# Patient Record
Sex: Male | Born: 1965
Health system: Southern US, Community
[De-identification: ages and names within clinical notes are randomized; demographics above are authoritative.]

## PROBLEM LIST (undated history)

## (undated) DIAGNOSIS — F1011 Alcohol abuse, in remission: Secondary | ICD-10-CM

## (undated) DIAGNOSIS — I10 Essential (primary) hypertension: Secondary | ICD-10-CM

## (undated) DIAGNOSIS — E785 Hyperlipidemia, unspecified: Secondary | ICD-10-CM

## (undated) DIAGNOSIS — I509 Heart failure, unspecified: Secondary | ICD-10-CM

## (undated) HISTORY — DX: Hyperlipidemia, unspecified: E78.5

## (undated) HISTORY — DX: Alcohol abuse, in remission: F10.11

## (undated) HISTORY — DX: Heart failure, unspecified: I50.9

## (undated) HISTORY — DX: Essential (primary) hypertension: I10

---

## 2000-12-29 ENCOUNTER — Emergency Department (HOSPITAL_COMMUNITY): Admission: EM | Admit: 2000-12-29 | Discharge: 2000-12-29 | Payer: Self-pay | Admitting: Emergency Medicine

## 2001-08-27 ENCOUNTER — Emergency Department (HOSPITAL_COMMUNITY): Admission: EM | Admit: 2001-08-27 | Discharge: 2001-08-27 | Payer: Self-pay | Admitting: Emergency Medicine

## 2003-11-29 ENCOUNTER — Emergency Department (HOSPITAL_COMMUNITY): Admission: EM | Admit: 2003-11-29 | Discharge: 2003-11-29 | Payer: Self-pay | Admitting: Emergency Medicine

## 2005-03-22 ENCOUNTER — Inpatient Hospital Stay (HOSPITAL_COMMUNITY): Admission: AD | Admit: 2005-03-22 | Discharge: 2005-03-26 | Payer: Self-pay | Admitting: Cardiology

## 2005-03-23 ENCOUNTER — Encounter: Payer: Self-pay | Admitting: Cardiovascular Disease

## 2005-03-24 ENCOUNTER — Ambulatory Visit: Payer: Self-pay | Admitting: Cardiology

## 2005-04-10 ENCOUNTER — Ambulatory Visit: Payer: Self-pay | Admitting: Internal Medicine

## 2005-04-24 ENCOUNTER — Ambulatory Visit: Payer: Self-pay | Admitting: Internal Medicine

## 2005-04-24 ENCOUNTER — Ambulatory Visit (HOSPITAL_COMMUNITY): Admission: RE | Admit: 2005-04-24 | Discharge: 2005-04-24 | Payer: Self-pay | Admitting: Internal Medicine

## 2005-05-01 ENCOUNTER — Ambulatory Visit: Payer: Self-pay | Admitting: Internal Medicine

## 2005-05-08 ENCOUNTER — Ambulatory Visit: Payer: Self-pay

## 2005-06-12 ENCOUNTER — Ambulatory Visit: Payer: Self-pay | Admitting: Internal Medicine

## 2005-07-03 ENCOUNTER — Ambulatory Visit: Payer: Self-pay | Admitting: Internal Medicine

## 2005-08-21 ENCOUNTER — Ambulatory Visit: Payer: Self-pay | Admitting: Internal Medicine

## 2005-10-09 ENCOUNTER — Ambulatory Visit: Payer: Self-pay | Admitting: Cardiology

## 2005-11-20 ENCOUNTER — Ambulatory Visit: Payer: Self-pay | Admitting: Cardiology

## 2006-01-22 ENCOUNTER — Ambulatory Visit: Payer: Self-pay | Admitting: Internal Medicine

## 2006-02-04 ENCOUNTER — Emergency Department (HOSPITAL_COMMUNITY): Admission: EM | Admit: 2006-02-04 | Discharge: 2006-02-04 | Payer: Self-pay | Admitting: Emergency Medicine

## 2006-02-08 ENCOUNTER — Ambulatory Visit: Payer: Self-pay | Admitting: Cardiology

## 2006-05-10 ENCOUNTER — Ambulatory Visit: Payer: Self-pay | Admitting: Cardiology

## 2006-05-23 ENCOUNTER — Encounter: Payer: Self-pay | Admitting: Cardiology

## 2006-05-23 ENCOUNTER — Ambulatory Visit: Payer: Self-pay | Admitting: Internal Medicine

## 2006-05-23 ENCOUNTER — Ambulatory Visit: Payer: Self-pay

## 2006-06-10 ENCOUNTER — Ambulatory Visit: Payer: Self-pay | Admitting: Cardiology

## 2006-07-12 ENCOUNTER — Ambulatory Visit: Payer: Self-pay

## 2006-08-19 ENCOUNTER — Ambulatory Visit: Payer: Self-pay | Admitting: Internal Medicine

## 2006-08-27 ENCOUNTER — Ambulatory Visit: Payer: Self-pay

## 2006-10-14 ENCOUNTER — Ambulatory Visit: Payer: Self-pay | Admitting: Internal Medicine

## 2007-01-21 ENCOUNTER — Ambulatory Visit: Payer: Self-pay | Admitting: Internal Medicine

## 2007-01-21 LAB — CONVERTED CEMR LAB
Calcium: 10.1 mg/dL (ref 8.4–10.5)
Chloride: 102 meq/L (ref 96–112)
Creatinine, Ser: 1.3 mg/dL (ref 0.4–1.5)
GFR calc non Af Amer: 65 mL/min
Glucose, Bld: 103 mg/dL — ABNORMAL HIGH (ref 70–99)
Potassium: 3.9 meq/L (ref 3.5–5.1)
Pro B Natriuretic peptide (BNP): 8 pg/mL (ref 0.0–100.0)
Sodium: 139 meq/L (ref 135–145)

## 2007-02-10 ENCOUNTER — Ambulatory Visit: Payer: Self-pay

## 2007-02-10 ENCOUNTER — Encounter: Payer: Self-pay | Admitting: Internal Medicine

## 2007-03-18 ENCOUNTER — Ambulatory Visit: Payer: Self-pay | Admitting: Cardiology

## 2007-06-17 ENCOUNTER — Ambulatory Visit: Payer: Self-pay | Admitting: Cardiology

## 2007-09-29 ENCOUNTER — Ambulatory Visit: Payer: Self-pay | Admitting: Cardiology

## 2007-11-25 ENCOUNTER — Ambulatory Visit: Payer: Self-pay | Admitting: Cardiology

## 2007-12-23 ENCOUNTER — Ambulatory Visit: Payer: Self-pay | Admitting: Cardiology

## 2007-12-23 LAB — CONVERTED CEMR LAB
AST: 33 units/L (ref 0–37)
Albumin: 4.1 g/dL (ref 3.5–5.2)
Alkaline Phosphatase: 53 units/L (ref 39–117)
CO2: 30 meq/L (ref 19–32)
Cholesterol: 200 mg/dL (ref 0–200)
GFR calc Af Amer: 66 mL/min
Glucose, Bld: 103 mg/dL — ABNORMAL HIGH (ref 70–99)
LDL Cholesterol: 130 mg/dL — ABNORMAL HIGH (ref 0–99)
Pro B Natriuretic peptide (BNP): 8 pg/mL (ref 0.0–100.0)
Sodium: 138 meq/L (ref 135–145)

## 2008-04-13 ENCOUNTER — Ambulatory Visit: Payer: Self-pay | Admitting: Cardiology

## 2008-04-13 LAB — CONVERTED CEMR LAB
CO2: 28 meq/L (ref 19–32)
Creatinine, Ser: 1.5 mg/dL (ref 0.4–1.5)
GFR calc non Af Amer: 55 mL/min
Glucose, Bld: 100 mg/dL — ABNORMAL HIGH (ref 70–99)
Potassium: 4.1 meq/L (ref 3.5–5.1)
Pro B Natriuretic peptide (BNP): 11 pg/mL (ref 0.0–100.0)

## 2009-01-05 DIAGNOSIS — I509 Heart failure, unspecified: Secondary | ICD-10-CM | POA: Insufficient documentation

## 2009-01-05 DIAGNOSIS — E785 Hyperlipidemia, unspecified: Secondary | ICD-10-CM

## 2009-01-05 DIAGNOSIS — I10 Essential (primary) hypertension: Secondary | ICD-10-CM | POA: Insufficient documentation

## 2009-01-06 ENCOUNTER — Ambulatory Visit: Payer: Self-pay | Admitting: Cardiology

## 2009-01-06 LAB — CONVERTED CEMR LAB
Creatinine, Ser: 1.4 mg/dL (ref 0.4–1.5)
Glucose, Bld: 125 mg/dL — ABNORMAL HIGH (ref 70–99)
Potassium: 4.3 meq/L (ref 3.5–5.1)
Pro B Natriuretic peptide (BNP): 16 pg/mL (ref 0.0–100.0)

## 2009-08-01 ENCOUNTER — Encounter (INDEPENDENT_AMBULATORY_CARE_PROVIDER_SITE_OTHER): Payer: Self-pay | Admitting: *Deleted

## 2009-08-30 ENCOUNTER — Ambulatory Visit: Payer: Self-pay | Admitting: Internal Medicine

## 2009-09-30 LAB — CONVERTED CEMR LAB
ALT: 35 units/L (ref 0–53)
AST: 43 units/L — ABNORMAL HIGH (ref 0–37)
Alkaline Phosphatase: 61 units/L (ref 39–117)
BUN: 22 mg/dL (ref 6–23)
CO2: 28 meq/L (ref 19–32)
Chloride: 103 meq/L (ref 96–112)
Cholesterol: 254 mg/dL — ABNORMAL HIGH (ref 0–200)
GFR calc non Af Amer: 84.59 mL/min (ref >60)
HDL: 47.2 mg/dL (ref >39.00)
Potassium: 4 meq/L (ref 3.5–5.1)
Total Bilirubin: 0.6 mg/dL (ref 0.3–1.2)
Total CHOL/HDL Ratio: 5
Total Protein: 7.5 g/dL (ref 6.0–8.3)
Triglycerides: 187 mg/dL — ABNORMAL HIGH (ref 0.0–149.0)
VLDL: 37.4 mg/dL (ref 0.0–40.0)

## 2009-10-04 ENCOUNTER — Encounter: Payer: Self-pay | Admitting: Cardiology

## 2010-02-14 ENCOUNTER — Ambulatory Visit: Payer: Self-pay | Admitting: Cardiology

## 2010-02-14 DIAGNOSIS — I426 Alcoholic cardiomyopathy: Secondary | ICD-10-CM

## 2010-10-08 LAB — CONVERTED CEMR LAB
CO2: 30 meq/L (ref 19–32)
Chloride: 103 meq/L (ref 96–112)
Creatinine, Ser: 1.2 mg/dL (ref 0.4–1.5)

## 2010-10-10 NOTE — Assessment & Plan Note (Signed)
Summary: 6 MONTH ROV/SL  Medications Added MULTIVITAMINS   TABS (MULTIPLE VITAMIN) 2 by mouth daily RED YEAST RICE EXTRACT  POWD (RED YEAST RICE EXTRACT) 1 by mouth daily      Allergies Added: NKDA   Visit Type:  Follow-up Primary Provider:  no  CC:  Cardiomyopathy.  History of Present Illness: The patient presents for followup of a dilated cardiomyopathy. He previously had an ejection fraction of about 20% felt to be related to EtOH or hypertension. His followup echo in 2008 demonstrated his EF to be 60%. He feels much better than he did in the past. He is breathing normally. He exercises routinely. He watches his salt and fluid. He drinks alcohol only on the weekends. He takes all of his medications as written below. He denies any shortness of breath, PND or orthopnea.  He denies any palpitations, presyncope or syncope.  Current Medications (verified): 1)  Bisoprolol-Hydrochlorothiazide 10-6.25 Mg Tabs (Bisoprolol-Hydrochlorothiazide) .... One By Mouth Dialy 2)  Lisinopril 20 Mg Tabs (Lisinopril) .... One By Mouth Two Times A Day 3)  Klor-Con M20 20 Meq Cr-Tabs (Potassium Chloride Crys Cr) .... As Needed 4)  Furosemide 20 Mg Tabs (Furosemide) .... As Needed 5)  Fish Oil 300 Mg Caps (Omega-3 Fatty Acids) .... Two Tabs Once Daily 6)  Aspirin 81 Mg  Tabs (Aspirin) .Marland Kitchen.. 1 By Mouth Daily 7)  Multivitamins   Tabs (Multiple Vitamin) .... 2 By Mouth Daily 8)  Red Yeast Rice Extract  Powd (Red Yeast Rice Extract) .Marland Kitchen.. 1 By Mouth Daily  Allergies (verified): No Known Drug Allergies  Past History:  Past Medical History:  1. Congestive heart failure secondary to nonischemic cardiomyopathy,       EF normal by echocardiogram (2008)  2. History of EtOH abuse.   3. Hypertension   4. Dyslipidemia.      Review of Systems       As stated in the HPI and negative for all other systems.   Vital Signs:  Patient profile:   45 year old male Height:      67 inches Weight:      240  pounds BMI:     37.73 Pulse rate:   67 / minute Resp:     16 per minute BP sitting:   110 / 78  (right arm)  Vitals Entered By: Marrion Coy, CNA (February 14, 2010 4:00 PM)  Physical Exam  General:  Well developed, well nourished, in no acute distress. Head:  normocephalic and atraumatic Neck:  Neck supple, no JVD. No masses, thyromegaly or abnormal cervical nodes. Chest Wall:  no deformities or breast masses noted Lungs:  Clear bilaterally to auscultation and percussion. Heart:  Non-displaced PMI, chest non-tender; regular rate and rhythm, S1, S2 without murmurs, rubs or gallops. Carotid upstroke normal, no bruit. Normal abdominal aortic size, no bruits. Femorals normal pulses, no bruits. Pedals normal pulses. No edema, no varicosities. Abdomen:  Bowel sounds positive; abdomen soft and non-tender without masses, organomegaly, or hernias noted. No hepatosplenomegaly. Msk:  Back normal, normal gait. Muscle strength and tone normal. Extremities:  No clubbing or cyanosis. Neurologic:  Alert and oriented x 3. Skin:  Intact without lesions or rashes. Cervical Nodes:  no significant adenopathy Inguinal Nodes:  no significant adenopathy Psych:  Normal affect.   EKG  Procedure date:  02/14/2010  Findings:      Sinus rhythm, rate 67, axis within normal limits, intervals within normal limits, no acute ST-T wave changes.  Impression & Recommendations:  Problem # 1:  CHF (ICD-428.0) The patient is doing well symptomatically and I would not suspect that his ejection fraction is lower. Therefore, no further cardiovascular testing is suggested. Orders: EKG w/ Interpretation (93000) TLB-BMP (Basic Metabolic Panel-BMET) (80048-METABOL)  Problem # 2:  HYPERTENSION (ICD-401.9) His  blood pressure is well controlled and she will continue on the meds as listed.  Orders: EKG w/ Interpretation (93000) TLB-BMP (Basic Metabolic Panel-BMET) (80048-METABOL)  Problem # 3:  ALCOHOLIC CARDIOMYOPATHY  (ICD-425.5) I have suggested complete abstinence.  Patient Instructions: 1)  Your physician recommends that you schedule a follow-up appointment in: 1 year with Dr Antoine Poche 2)  Your physician recommends that you have lab work today 3)  Your physician recommends that you continue on your current medications as directed. Please refer to the Current Medication list given to you today. 4)  You have been diagnosed with Congestive Heart Failure or CHF.  CHF is a condition in which a problem with the structure or function of the heart impairs its ability to supply sufficient blood flow to meet the body's needs.  For further information please visit www.cardiosmart.org for detailed information on CHF. 5)  Your physician recommends that you weigh, daily, at the same time every day, and in the same amount of clothing.  Please record your daily weights on the handout provided and bring it to your next appointment.

## 2010-10-10 NOTE — Letter (Signed)
Summary: Custom - Lipid  [facility], Main Office  [address]   [place], [place] [address]   Phone: [phone]  Fax: [phone]         October 04, 2009 MRN: [id number]   [facility] [address] [place], [place]  [address]   Dear [name],  We have reviewed your cholesterol results.  They are as follows:     Total Cholesterol:    254 (Desirable: less than 200)       HDL  Cholesterol:     47.20  (Desirable: greater than 40 for men and 50 for women)       LDL Cholesterol:       130  (Desirable: less than 100 for low risk and less than 70 for moderate to high risk)       Triglycerides:       187.0  (Desirable: less than 150)  Our recommendations include:  DR Cyana Shook WOULD LIKE FOR YOU CHANGE YOUR CHOLESTROL MEDICATION TO CRESTOR 20 MG A DAY AND REPEAT FASTING LAB IN 8 WEEKS.  PLEASE CALL OUR OFFICE TO LET us KNOW WHAT PHARMACY TO USE AND TO SCHEDULE YOUR LAB WORK.   Call our office at the number listed above if you have any questions.  Lowering your LDL cholesterol is important, but it is only one of a large number of "risk factors" that may indicate that you are at risk for heart disease, stroke or other complications of hardening of the arteries.  Other risk factors include:   A.  Cigarette Smoking* B.  High Blood Pressure* C.  Obesity* D.   Low HDL Cholesterol (see yours above)* E.   Diabetes Mellitus (higher risk if your is uncontrolled) F.  Family history of premature heart disease G.  Previous history of stroke or cardiovascular disease        *These are risk factors YOU HAVE CONTROL OVER.  For more information, visit .  There is now evidence that lowering the TOTAL CHOLESTEROL AND LDL CHOLESTEROL can reduce the risk of heart disease.  The American Heart Association recommends the following guidelines for the treatment of elevated cholesterol:  1.  If there is now current heart disease and less than two risk factors,  TOTAL CHOLESTEROL should be less than 200 and LDL CHOLESTEROL should be less than 100. 2.  If there is current heart disease or two or more risk factors, TOTAL CHOLESTEROL should be less than 200 and LDL CHOLESTEROL should be less than 70.  A diet low in cholesterol, saturated fat, and calories is the cornerstone of treatment for elevated cholesterol.  Cessation of smoking and exercise are also important in the management of elevated cholesterol and preventing vascular disease.  Studies have shown that 30 to 60 minutes of physical activity most days can help lower blood pressure, lower cholesterol, and keep your weight at a healthy level.  Drug therapy is used when cholesterol levels do not respond to therapeutic lifestyle changes (smoking cessation, diet, and exercise) and remains unacceptably high.  If medication is started, it is important to have you levels checked periodically to evaluate the need for further treatment options.    Thank you,    [facility] Team PAM FLEMING-HAYES, RN FOR  DR. Rollene Rotunda

## 2011-01-23 NOTE — Assessment & Plan Note (Signed)
[facility]                            CARDIOLOGY OFFICE NOTE   CHANDLER, SWIDERSKI                       MRN:          [id number]  DATE:09/29/2007                            DOB:          August 17, 1966    PRIMARY CARE PHYSICIAN:  None.   REASON FOR PRESENTATION:  Evaluate patient with nonischemic  cardiomyopathy.   HISTORY OF PRESENT ILLNESS:  The patient presents for followup of the  above.  Since I last saw him, he has been doing well and has had med  titration.  He has had a followup echocardiogram and has an ejection  fraction, now, of 60%.  This was from June of 2008.  He was abstaining  from alcohol, but actually admits that he is drinking again.  He denies  any shortness of breath, PND, or orthopnea.  In fact, he says he is able  to run 4 miles at a time.  He denies any chest discomfort, neck or arm  discomfort.  He has had no palpitations, presyncope, or syncope.   PAST MEDICAL HISTORY:  1. Congestive heart failure, nonischemic (cardiac catheterization with      normal coronary arteries, ejection fraction 10%, this was 2006,      this was thought to be secondary to ETOH).  2. Hypertension.  3. Medical noncompliance.  4. Obesity.   ALLERGIES:  None.   MEDICATIONS:  1. Multivitamin.  2. Bisoprolol 10/6.25 daily.  3. Lisinopril 20 mg b.i.d.  4. Potassium 10 mEq daily.  5. Multivitamin.  6. Lasix 10 mg daily.  7. Fish oil.   REVIEW OF SYSTEMS:  As stated in the HPI and otherwise negative for  other systems.   PHYSICAL EXAMINATION:  The patient is in no distress.  Blood pressure 120/80, heart rate 62 and regular, weight 233 pounds,  body mass index 35.  HEENT:  Eyelids unremarkable, pupils are equal, round, and reactive to  light, fundi not visualized.  Oral mucosa unremarkable.  NECK:  No jugular venous distension at 45 degrees, carotid upstroke  brisk and symmetrical.  No bruit.  No thyromegaly.  LYMPHATICS:  No cervical,  axillary, inguinal adenopathy.  LUNGS:  Clear to auscultation bilaterally.  BACK:  No costovertebral angle tenderness.  CHEST:  Unremarkable.  HEART:  PMI not displaced or sustained.  S1 and S2 are within normal  limits.  No S3, no S4.  No clicks, no rubs, no murmurs.  ABDOMEN:  Obese, positive bowel sounds, normal in frequency and pitch.  No bruits, no rebound, no guarding.  No midline pulsatile mass.  No  hepatomegaly, no splenomegaly.  SKIN:  No rashes, no nodules.  EXTREMITIES:  2+ pulses throughout, no edema.  No cyanosis, no clubbing.  NEURO:  Oriented to person, place, and time.  Cranial nerves II-XII  grossly intact, motor grossly intact.   LABS:  Cholesterol 263, LDL 199, HDL 41.   EKG sinus rhythm, rate 63, leftward axis, no acute ST-T wave changes.   ASSESSMENT AND PLAN:  1. Cardiomyopathy:  The patient has an improved ejection fraction.  He  is tolerating the medications as listed.  Unfortunately, he is back      to drinking alcohol.  I told him he needs to abstain completely or      he may end up with a reduced ejection fraction again.  2. Dyslipidemia:  Significant dyslipidemia.  He needs simvastatin 40      mg daily.  I have asked that he get a lipid profile and liver      enzymes checked in 8 weeks when he comes back for followup.  3. Obesity:  He understands the need to lose weight with diet and      exercise.  4. Hypertension:  The blood pressure is controlled with the      medications as listed.  5. Followup:  He will come back in 8 weeks in the heart failure      clinic.     Rollene Rotunda, MD, [facility]  Electronically Signed    JH/MedQ  DD: 09/29/2007  DT: 09/29/2007  Job #: [id number]

## 2011-01-23 NOTE — Assessment & Plan Note (Signed)
[facility]                          CHRONIC HEART FAILURE NOTE   Brendan, Rogers                       MRN:          [id number]  DATE:11/25/2007                            DOB:          Mar 14, 1966    PRIMARY CARDIOLOGIST:  Rollene Rotunda, MD, [facility]   PRIMARY CARE PHYSICIAN:  No primary care physician at this time.   [place] returns today for follow-up of his congestive heart failure which is  secondary to nonischemic cardiomyopathy with EF previously less than 10%  by cardiac catheterization in 2006, with improvement in his ejection  fraction now back to normal confirmed by echocardiogram.  Brendan Rogers has been  doing quite well since I last saw him last fall.  He has followed up  with Dr. Antoine Poche since that time.  He has maintained class I symptoms.  Unfortunately, he was laid off from his job a month ago and is applying  for unemployment and medicate assistance with his prescriptions and  medical expenses.  Otherwise Brendan Rogers continues to exercise on a daily basis.  He is now unable to run two miles a day and goes to the gym and works  out two days a week.  Brendan Rogers is back to drinking a few beers especially  since he has been home from work, but states this is not a problem at  this time.   PAST MEDICAL HISTORY:  1. Congestive heart failure secondary to nonischemic cardiomyopathy      status post myocardial infarction in 2006.  Catheterization at that      time revealed an EF less than 10% with normal coronaries.  Most      recent echocardiogram done in June 2008 showed EF back to normal.      T-wave alternans test in the past at low risk tracing.  2. History EtOH abuse.  3. Hypertension.  4. History of medical noncompliance.  5. Status post EPX test in 2006.  6. Dyslipidemia.   REVIEW OF SYSTEMS:  As stated above, otherwise negative.   CURRENT MEDICATIONS:  1. Bisoprolol/hydrochlorothiazide 10/6.25 mg daily.  2. Multivitamin daily.  3. Lisinopril 20  b.i.d.  4. Lasix 20 mg half a tablet daily.  5. Fish oil 1000 mg daily.  6. Simvastatin 40 mg nightly.  7. KCl 10 mEq daily.   PHYSICAL EXAMINATION:  VITAL SIGNS:  Weight 241, weight is up 8 pounds  from January.  Blood pressure 118/76 with a heart rate of 69.  GENERAL APPEARANCE:  Brendan Rogers was in no acute distress.  NECK:  No signs of jugular vein distention at 45 degree angle.  LUNGS:  Clear to auscultation.  CARDIOVASCULAR:  Exam reveals S1 and S2, regular rate and rhythm.  ABDOMEN:  Soft, nontender, positive bowel sounds.  LOWER EXTREMITIES:  Without clubbing, cyanosis or edema.  NEUROLOGICAL:  Alert and oriented x3.   IMPRESSION:  1. Congestive heart failure without signs of volume overload with      ejection fraction now back to normal.  Medications without      problems.  Have cautioned patient in any use of alcohol.  2. Dyslipidemia.  The patient is tolerating Statin without problems.      Have arranged for him to have fasting lab work done within the next      couple of weeks as he has eaten today and follow up with Dr.      Antoine Poche concerning that.  Will see Brendan Rogers back in three months for      follow up, sooner if he has any problems.  I have asked him to      establish care with a primary care physician.      Dorian Pod, ACNP  Electronically Signed      Rollene Rotunda, MD, [facility]  Electronically Signed   MB/MedQ  DD: 11/25/2007  DT: 11/26/2007  Job #: [id number]

## 2011-01-23 NOTE — Assessment & Plan Note (Signed)
[facility]                          CHRONIC HEART FAILURE NOTE   Brendan Rogers, Brendan Rogers                       MRN:          [id number]  DATE:03/18/2007                            DOB:          September 23, 1965    Brendan Rogers returns today for followup of his congestive heart failure which is  secondary to nonischemic cardiomyopathy, with EF previously less than  10% by cardiac catheterization in 2006.  I just repeated echocardiogram  in June of this year, EF has improved to 55% to 60%.  Brendan Rogers previously had  a history of noncompliance, but over the last year he has done quite  well in taking medications appropriately, following his diet, and  increasing his exercise.  Much of this has been secondary to his wife's  insistence.  He states, however, he feels much better and is glad that  he has made the life-changing habits.  He continues to work out at [employer].  He is up to running about a mile a day without any shortness of  breath.  He is doing some light weight strength training.  Denies any  new problems.   PAST MEDICAL HISTORY:  1. Congestive heart failure secondary to nonischemic cardiomyopathy,      status post MI in 2006.  Cardiac catheterization at that time      revealed an EF of less than 10%.  The patient had normal      coronaries.  Most recent echocardiogram in June of this year showed      an EF back to normal.  The patient has undergone a T-wave alternans      test that had a negative tracing.  2. History of ETOH abuse.  3. Hypertension.  4. History of medical noncompliance.  5. Status post CPX test 2006 with a VO2 of 31.1, a VE/VCO2 slope of 30      and an O2 pulse ox of 100% predicted.   REVIEW OF SYSTEMS:  As stated above.   CURRENT MEDICATIONS:  1. Multivitamin daily.  2. Bisoprolol/hydrochlorothiazide 10/6.25 daily.  3. Lisinopril 20 in the a.m., 10 in the evening.  4. KCl 10 daily.  5. Lasix 20 daily.   PHYSICAL EXAMINATION:  Weight  232, blood pressure initially recorded at  134/92, repeat manual 126/90 in the right arm.  Brendan Rogers is in no acute distress.  HEENT:  Unremarkable.  NECK:  Supple, no JVD.  LUNGS:  Clear to auscultation.  CARDIOVASCULAR EXAM:  Reveals an S1, S2, regular rate and rhythm.  ABDOMEN:  Soft, nontender, positive bowel sounds.  SKIN:  Warm and dry.  LOWER EXTREMITIES:  Without cyanosis, clubbing, or edema.  NEUROLOGICAL:  Alert and oriented x3.   IMPRESSION:  Class I [place] Heart Association heart failure.  Will  continue beta blocker at current dose and increase Lisinopril to 20  b.i.d.  Decrease Lasix to 10 daily, continue KCl at 10 daily.  See  patient back in 2-3 months, sooner if he has any problems.  The  patient's primary cardiologist is Simona Huh, no primary care  physician.  Dorian Pod, ACNP  Electronically Signed      Rollene Rotunda, MD, [facility]  Electronically Signed   MB/MedQ  DD: 03/18/2007  DT: 03/19/2007  Job #: [id number]

## 2011-01-23 NOTE — Assessment & Plan Note (Signed)
[facility]                          CHRONIC HEART FAILURE NOTE   Brendan, Rogers                       MRN:          [id number]  DATE:04/13/2008                            DOB:          Jul 08, 1966    PRIMARY CARDIOLOGIST:  Rollene Rotunda, MD, [facility]   No primary care physician at this time.   Brendan Rogers returns today for further followup of his congestive heart failure  which is secondary to nonischemic cardiomyopathy with a EF previously  10% by cardiac catheterization with EF now back to normal confirmed by  echocardiogram.  Brendan Rogers has been doing well since I last saw him in the  Spring.  He continues to run 1-1/2-2 miles every morning.  Brendan Rogers has  recently been laid off from work.  He has run out of his lisinopril, but  states he will get that refilled.  States he actually did not have any  refills left and he was coming in today.  He has been concerned about  being able to afford his medications.  I have given him samples of  Lipitor to substitute for his simvastatin, states compliance with his  bisoprolol and Lasix.   PAST MEDICAL HISTORY:  1. Congestive heart failure secondary to nonischemic cardiomyopathy,      EF normal by echocardiogram recently.  2. History of EtOH abuse.  3. Hypertension  4. Dyslipidemia.   REVIEW OF SYSTEMS:  As stated above, otherwise negative.   CURRENT MEDICATIONS:  1. Bisoprolol/hydrochlorothiazide 10/6.25 daily.  2. Multivitamin daily.  3. Lisinopril 20 b.i.d.  4. Lasix 10 mg daily.  5. Fish oil 1000 mg t.i.d.  6. Simvastatin 40 or Lipitor 20 daily.  7. KCl 10 mEq daily.   PHYSICAL EXAMINATION:  VITAL SIGNS:  Weight 246 pounds, weight is up 5  pounds; blood pressure 113/79 with a heart rate of 79.  GENERAL:  Brendan Rogers in no acute distress.  No signs of jugular vein distention  at 45 degree-angle.  LUNGS:  Clear to auscultation bilaterally.  CARDIOVASCULAR EXAM:  Reveals S1 and S2.  Regular rate and rhythm.  ABDOMEN:   Soft, nontender, and positive bowel sounds.  EXTREMITIES:  Without clubbing, cyanosis, or edema.  NEUROLOGIC:  Alert and oriented x3.   IMPRESSION:  Congestive heart failure without signs of volume overload.  Brendan Rogers has remained quite stable for over a year now.  Lab work was last  checked in April of this year.  We will go ahead and repeat a BMET and  BMP on him today.  I am going to change his furosemide and potassium to  p.r.n.  He is instructed to take 10 mg of Lasix and 10 mEq of potassium  if his weight increases 2 pounds in 24 hours, otherwise continue the  bisoprolol and lisinopril at current doses.  I have sent in refills on  them and also given him another 8-week supply of Lipitor.  I will see  him back in 4 months, sooner if he has any problems.      Dorian Pod, ACNP  Electronically Signed      Brendan Rogers  Brendan Poche, MD, [facility]  Electronically Signed   MB/MedQ  DD: 04/13/2008  DT: 04/14/2008  Job #: [id number]

## 2011-01-23 NOTE — Assessment & Plan Note (Signed)
[facility]                          CHRONIC HEART FAILURE NOTE   Brendan Rogers, Brendan Rogers                       MRN:          [id number]  DATE:01/21/2007                            DOB:          [birth year]    Brendan Rogers returns today for followup of his congestive heart failure which is  secondary to nonischemic cardiomyopathy with a ejection fraction of less  then 10% by cardiac catheterization in July of 2006. Most recent  echocardiogram in September of 2007 showed an ejection fraction of 20%  to 30% with severe diffuse left ventricular hypokineses. Patient  underwent a T wave alternans test that showed no sustained alternans and  max negative heart rate greater than or equal to 105 consistent with a  negative tracing. Brendan Rogers states he has been doing quite well. He continues  to work full time. He continues to work out at [employer]. He is up to running  1 mile a day without becoming short of breath. He is also walking on the  treadmill some, trying to do a little bit of elliptical and light weight  strength training. He lifts 30 pounds at a time. He states he actually  feels good and denies any problems.   PAST MEDICAL HISTORY:  Includes;  1. Congestive heart failure secondary to nonischemic cardiomyopathy,      status post non ST elevated myocardial infarction in July of 2006.      Cardiac catheterization at that time revealed an ejection fraction      of less than 10%, patient with normal coronaries. Echo in September      2007 showed an ejection fraction of 20% to 30%. Status post T wave      alternans test consistent with a negative tracing.  2. History of alcohol abuse.  3. Hypertension.  4. History of medical noncompliance.  5. Status post CPX test 2006 with a VO2 of 31.1, with a VE/VCO2 slope      of 30, and an O2 pulse oximetry of 100% of predicted.   REVIEW OF SYSTEMS:  As stated above.   CURRENT MEDICATIONS:  Include;  1. Lasix 40 mg daily.  2.  Multivitamin daily.  3. Bisoprolol/hydrochlorothiazide 10/6.25 mg daily.  4. Lisinopril 20 mg in a.m. and 10 in the evening.  5. KCl 20 mEq daily.   PHYSICAL EXAMINATION:  Weight 238 pounds, weight is up 8 pounds from  February, blood pressure 116/76, with a pulse of 64. Brendan Rogers is in no acute  distress.  No jugular vein distension at 45 degree angle.  LUNGS: Clear to auscultation bilaterally.  CARDIOVASCULAR EXAM: Reveals an S1, S2 with regular rate and rhythm.  ABDOMEN: Soft, nontender with positive bowel sounds. Mildly obese.  LOWER EXTREMITIES: Without clubbing, cyanosis, or edema.  NEUROLOGICALLY: Patient alert and oriented x3.   IMPRESSION:  Stable class 1 heart failure at this time. We will decrease  Lasix to 20 mg daily, decrease potassium to 10 mEq daily, and check  blood work today. I am going to arrange for patient to repeat his  echocardiogram as I suspect there is an  improvement in his ejection  fraction and I will see him back in 2 months, sooner if he has any  problems. Patient's primary cardiologist is Dr. Antoine Rogers. I am going to  arrange for patient to see Dr. Antoine Rogers in followup.      Brendan Rogers, ACNP       Brendan Rogers. Bensimhon, MD    MB/MedQ  DD: 01/21/2007  DT: 01/21/2007  Job #: [id number]

## 2011-01-23 NOTE — Assessment & Plan Note (Signed)
[facility]                          CHRONIC HEART FAILURE NOTE   Brendan Rogers, Brendan Rogers                       MRN:          [id number]  DATE:06/17/2007                            DOB:          13-Jun-1966    PRIMARY CARDIOLOGIST:  Rollene Rotunda, MD, [facility]   The patient returns today for follow-up of his congestive heart failure  which is secondary to nonischemic cardiomyopathy with EF previously less  than 10% by cardiac catheterization in 2006 with improvement ejection  fraction now back to normal.  The patient has been doing quite well.  He  previously had a history of noncompliance with excessive EtOH use, but  over the last year has made remarkable improvements in his overall  health.  However, he still has some improvement to do in regards to his  diet.  He enjoys eating at [employer] for lunch when he is out at work  and frequents the double cheeseburger.  Needless to say he recently had  his cholesterol checked through an organization at work and was told his  cholesterol was elevated.  He has really not made any changes in his  diet to improve that.  He does, however, exercise.  He runs 3 miles six  days a week and also works out at [employer] three days a week.  He denies  any symptoms of volume overload, lightheadedness, or dizziness.  No  chest discomfort and is very compliant with his medication.  He is  accompanied by his wife who states that he has been doing quite well.   PAST MEDICAL HISTORY:  1. Congestive heart failure secondary to nonischemic cardiomyopathy      status post myocardial infarction in 2006.  Cardiac catheterization      at that time revealed an EF of less than 10%.  The patient was      found to have normal coronaries.  Most recent echocardiogram done      in June of 2008 showed EF back to normal.  The patient also has      undergone a T wave Alternans Test in the past that had a low risk      tracing.  2. History of EtOH  abuse.  3. Hypertension.  4. History of medical noncompliance.  5. Status post CPX test in 2006.   REVIEW OF SYSTEMS:  As stated above.   CURRENT MEDICATIONS:  1. Multivitamin daily.  2. Bisoprolol HCT 10/6.25 mg daily.  3. KCL 10 mEq daily.  4. Centrum Cardio multivitamin daily.  5. Lisinopril 20 mg b.i.d.  6. Lasix 10 mg daily.   PHYSICAL EXAMINATION:  VITAL SIGNS:  Weight 234 pounds, blood pressure  100/72 with heart rate of 62.  NECK:  No signs of jugular venous distention at 45 degree angle.  LUNGS:  Clear to auscultation.  CARDIOVASCULAR:  S1 and S2 regular rate and rhythm.  ABDOMEN:  Soft and nontender with positive bowel sounds.  EXTREMITIES:  Lower extremities without cyanosis, clubbing, or edema.  NEUROLOGY:  Alert and oriented.   IMPRESSION:  Class I [place] Heart Association  heart failure.  Continue  current medications.  I had planned on stopping the patient's diuretic  and potassium at this time, however, he continues to consume fast food  that is high in sodium.  I have made a deal with him, if he will stop  the fast food, I will stop his Lasix and potassium and see how is volume  status remains stable.  I have asked him to start Fish Oil 1000 mg  daily, to eat oatmeal every morning for breakfast, and continue the  [employer] multivitamin.  I scheduled him for fasting blood work and  then a follow-up appointment with Dr. Antoine Poche for routine cardiology  visit.      Dorian Pod, ACNP  Electronically Signed      Rollene Rotunda, MD, [facility]  Electronically Signed   MB/MedQ  DD: 06/17/2007  DT: 06/18/2007  Job #: [id number]

## 2011-01-26 NOTE — Assessment & Plan Note (Signed)
[facility]                   COUMADIN / CHRONIC HEART FAILURE CLINIC NOTE   Brendan Rogers, Brendan Rogers                       MRN:          [id number]  DATE:05/10/2006                            DOB:          1965/11/21    [name] returns today for further evaluation medication titration of  his congestive heart failure secondary to nonischemic cardiomyopathy.  Mr.  Brendan Rogers states he has been feeling quite well.  He denies any episodes of  angina, shortness of breath, or orthopnea, PND, presyncope, or syncope.  Denies any peripheral edema.  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  He states compliance with his medication.  This has  been an issue in the past at which times [name] has not taken his  heart failure very seriously and has continued to be noncompliant with his  medications.  He states he is taking his medications, continues to watch his  diet.  He does occasionally have a beer but limits this to 1 beer maybe once  a week.  Overall, he states he has been feeling quite well.   PAST MEDICAL HISTORY:  1. Nonischemic cardiomyopathy, status post non-ST elevation MI in July      2006. Cardiac catheterization at that time revealed an ejection      fraction of less than 10%.  The patient was found to have normal      coronaries by cardiac catheterization.  Most recent echocardiogram, in      August 2006, showed an EF of less than 25%.  2. History of alcohol abuse.  3. Hypertension.  4. Medical noncompliance.   REVIEW OF SYSTEMS:  As stated above in history of present illness, otherwise  negative.   CURRENT MEDICATIONS:  1. Enalapril 5 mg b.i.d.  2. Lasix 40 mg daily.  3. Multivitamin one tablet daily.  4. Bisoprolol HCT 10/6.25 mg daily.   PHYSICAL EXAMINATION:  VITAL SIGNS:  Weight today 232 pounds, blood pressure  133/79,  pulse of 67.  GENERAL:  Brendan Rogers is looking rather fit today.  He is alert and  oriented x3.  Cranial nerves II-XII grossly intact.  No acute distress.  Very pleasant cooperative gentleman.  NECK:  Supple without lymphadenopathy.  Negative JVD.  LUNGS:  Clear to auscultation.  CARDIOVASCULAR:  S1 and S2.  Regular rate and rhythm.  ABDOMEN:  Soft nontender.  Positive bowel sounds.  LOWER EXTREMITIES:  Without clubbing, cyanosis, or edema.   IMPRESSION:  Stable nonischemic cardiomyopathy, heart failure.   The patient is tolerating full dose bisoprolol.  We will plan on repeating  his echocardiogram within the next month, also check blood work, kidney  function as I do not have any recorded since March, I believe.  Continue  current medications.  We will discuss further possibilities with the  patient, after ultrasound has been completed.  I touched on the possibility  of a need for defibrillator if heart is still weak.  We will discuss further  after ultrasound  is completed.                                 Dorian Pod, ACNP                            Rollene Rotunda, MD, [facility]   MB/MedQ  DD:  05/10/2006  DT:  05/11/2006  Job #:  [id number]

## 2011-01-26 NOTE — Assessment & Plan Note (Signed)
[facility]                   COUMADIN / CHRONIC HEART FAILURE CLINIC NOTE   ARHUM, PEEPLES                       MRN:          [id number]  DATE:06/10/2006                            DOB:          07/02/1966    [name] returns today for further evaluation, medication titration for  his congestive heart failure secondary to nonischemic cardiomyopathy with an  EF of less than 10% by cardiac catheterization in July, 2006.  I recently  repeated patient's echocardiogram for re-evaluation.  At this time,  patient's ejection fraction is 20-30% with severe diffuse left ventricular  hypokinesis.   [name] is essentially asymptomatic with his heart failure.  He  continues to work eight hour days without any shortness of breath, dyspnea  on exertion.  He does some mild lifting, up to 25 pounds, at his place of  employment.  He is denying any orthopnea or PND.   [name] has had a problem with compliance in the past; however, states  he has been taking his medications as ordered at this time.  He continues to  drink maybe one beer a week on the weekend, and I feel that he is more in  tune to his heart failure and is taking his medical diagnosis more serious  than he has in the past.   PAST MEDICAL HISTORY:  1. Congestive heart failure/nonischemic cardiomyopathy, status post non-ST      elevated MI in July, 2006.  Cardiac catheterization at that time      revealed an EF of less than 10%.  Patient was found to have normal      coronaries by cardiac catheterization.  The most recent echocardiogram      dated September 13th shows an EF of 20-30%.  2. History of alcohol abuse.  3. Hypertension.  4. History of medical noncompliance.  5. Cardiopulmonary exercise test done in August, 2006 showed a VO2 of 31.1      with a VE/VCO2 slope of 30 and an O2 pulse oximetry of 100% of      predicted.   REVIEW OF SYSTEMS:  As stated above in the  history of present illness,  otherwise negative.   CURRENT MEDICATIONS:  1. Enalapril 5 mg b.i.d.  2. Lasix 40 mg daily.  3. Multivitamin daily.  4. Bisoprolol/HCT 10/6.25 mg daily.   PHYSICAL EXAMINATION:  VITAL SIGNS:  Weight 233.  Blood pressure 126/81,  pulse 87.  GENERAL:  [name] is in no acute distress.  NECK:  No jugular venous distention at a 45 degree angle.  LUNGS:  Clear to auscultation bilaterally.  CARDIOVASCULAR:  An S1 and S2, regular rate and rhythm.  ABDOMEN:  Soft and nontender.  Positive bowel sounds.  EXTREMITIES:  Lower extremities without clubbing, cyanosis or edema.  NEUROLOGIC:  Patient alert and oriented x3.  Cranial nerves II-XII grossly  intact.   IMPRESSION:  Patient with stable class II heart failure/nonischemic  cardiomyopathy with recent repeat echocardiogram showing no improvement in  his ejection fraction from this time last year.  I have touched on the  subject of defibrillator with patient in  the past.  He is concerned that he  financially cannot afford to miss any work to have the defibrillator done,  and he states that he cannot do it at this time.  I am going to schedule the  patient for a T wave alternans test for further evaluation.  I have given  patient written information regarding the abnormal heart rhythms and  defibrillators.  I will see him back after his T wave test.      ______________________________  Brendan Rogers, ACNP    ______________________________  Rollene Rotunda, MD, [facility]   MB/MedQ  DD:  06/10/2006  DT:  06/11/2006  Job #:  [id number]

## 2011-01-26 NOTE — H&P (Signed)
[name], Brendan                ACCOUNT NO.:  [id number]   MEDICAL RECORD NO.:  [id number]          PATIENT TYPE:  INP   LOCATION:  2923                         FACILITY:  [facility]   PHYSICIAN:  Jonelle Sidle, M.D. LHCDATE OF BIRTH:  [birth year]   DATE OF ADMISSION:  03/22/2005  DATE OF DISCHARGE:                                HISTORY & PHYSICAL   PRIMARY CARE PHYSICIAN:  None.   REASON FOR ADMISSION:  Chest pain and evidence of acute coronary syndrome.   HISTORY OF PRESENT ILLNESS:  [name] is a 45 year old male with no  reported major medical conditions who states that at approximately 4 p.m.  today while watching television he developed a burning chest discomfort with  subsequent radiation to his left arm that was followed by an episode of  emesis.  The chest and arm discomfort lasted for almost one hour at rest,  and prompted a visit to the [facility] emergency department.  He was  stabilized with medical therapy and remained pain free, and at that time had  nonspecific ST segment changes, predominantly noted in the inferior leads by  electrocardiogram.  His troponin I levels returned abnormal, peaking  initially at 1.83, consistent with a non-ST elevation myocardial infarction,  and transfer was subsequently arranged to our facility in anticipation of  diagnostic coronary angiography.   In speaking with Mr. Gongora in more detail, he states that for the last  month he has had exertional shortness of breath, as well as intermittent  chest pain with exertion.  These are unusual symptoms for him, and had been  somewhat limiting.  He does not have any regular medical follow up at  baseline.   ALLERGIES:  No known drug allergies.   MEDICATIONS AT HOME:  None.   PAST MEDICAL HISTORY:  1.  No reported major surgeries.  2.  No regular medical follow up.   SOCIAL HISTORY:  The patient lives in [place].  He denies any  significant tobacco use, although he does  drink alcohol regularly, including  three shots each day.  He works on a 2-driver truck, and does some  physical lifting.Marland Kitchen   FAMILY HISTORY:  Significant for coronary artery disease in the patient's  uncle in his 32s.   REVIEW OF SYSTEMS:  As found in the history of present illness.  He has had  no fevers, chills, cough, hemoptysis, melena, hematochezia, peripheral  edema, orthopnea, PND, palpitations, or syncope.  Systems otherwise  negative.   PHYSICAL EXAMINATION:  VITAL SIGNS:  Blood pressure is 105/68, heart rate 90  in sinus rhythm, oxygen saturation 100% on 2 liters nasal cannula,  respirations 20, afebrile.  GENERAL:  This is a well-nourished male lying in bed in no acute distress.  HEENT:  Conjunctivae and lids are normal.  Oropharynx is clear.  NECK:  Supple without elevated jugular venous pressure, without carotid  bruits.  No thyromegaly is noted.  LUNGS:  Clear to auscultation with no labored breathing at rest.  CARDIAC:  A regular rate and rhythm without loud murmur or S3 gallop.  There  is no pericardial rub.  ABDOMEN:  Soft and nontender with normoactive bowel sounds.  EXTREMITIES:  No clubbing, cyanosis, or edema.  Peripheral pulses are 2+.  SKIN:  No ulcerative changes are noted.  MUSCULOSKELETAL:  No kyphosis is noted.  NEUROPSYCHIATRIC:  The patient is alert and oriented x3.   LABORATORY DATA:  WBC's of 5.8, hemoglobin 12.8, hematocrit 37.7, platelets  270.  D-dimer 0.53.  Sodium 139, potassium 3.8, chloride 112, bicarbonate  22, glucose 122, BUN 16, creatinine 1.1.  Troponin I of 0.27, up to 1.21, up  to 1.83.  BNP of 506.   Chest x-ray pending.   IMPRESSION:  1.  Evidence of non-ST elevation myocardial infarction with initial peak      troponin I level of 1.83 by point of care markers and nonspecific      electrocardiographic changes.  2.  Regular alcohol use.  3.  Unknown lipid status.  4.  No regular medical follow up.   PLAN:  1.  The patient is  now admitted to the coronary care unit.  Will continue to      cycle cardiac markers.  2.  Treat with aspirin, Integrilin, heparin, nitroglycerin, Lopressor, and      Plavix for the time being.  3.  Follow up with fasting lipid profile in the morning.  4.  Alcohol withdrawal prophylaxis.  5.  Anticipate diagnostic coronary angiography in the morning.  I reviewed      the risks and benefits with the patient, and he agrees to proceed.        SGM/MEDQ  D:  03/22/2005  T:  03/22/2005  Job:  [id number]

## 2011-01-26 NOTE — Cardiovascular Report (Signed)
Brendan Rogers, Brendan Rogers                ACCOUNT NO.:  [id number]   MEDICAL RECORD NO.:  [id number]          PATIENT TYPE:  INP   LOCATION:  2923                         FACILITY:  [facility]   PHYSICIAN:  Rollene Rotunda, M.D.   DATE OF BIRTH:  [birth year]   DATE OF PROCEDURE:  03/23/2005  DATE OF DISCHARGE:                              CARDIAC CATHETERIZATION   PRIMARY CARE PHYSICIAN:  None.   CARDIOLOGIST:  Jonelle Sidle, M.D.   PROCEDURE:  Left heart catheterization/coronary arteriography.   OPERATOR:  Rollene Rotunda, M.D.   INDICATIONS FOR PROCEDURE:  Evaluate a patient with chest pain and shortness  of breath.  He also ruled in for a non-Q-wave myocardial infarction.   DESCRIPTION OF PROCEDURE:  The left and right heart catheterization  performed via the right femoral artery and right femoral vein respectively.  Both vessels were cannulated using an anterior wall puncture.  A 6-French  arterial sheath and a 7-French venous sheath were inserted via the modified  Seldinger technique.  A pre-formed Judkins, pigtail and a Swan-Ganz catheter  were utilized.  The patient tolerated the procedure well and left the  laboratory in stable condition.   RESULTS:  HEMODYNAMICS  RA mean:  32.  RV:  51/19 with a mean of 29.  PA:  53/32 with a mean of 42.  Pulmonary capillary wedge pressure:  Mean 40.  AO:  91/64.  LV:  94/36.  Cardiac output/Cardiac index (Fick), 3.9/1.8.   CORONARIES:  1.  LEFT MAIN CORONARY ARTERY:  The left main coronary was normal.  2.  LEFT ANTERIOR DESCENDING CORONARY ARTERY:  The left anterior descending      coronary artery had a proximal 25% stenosis.  There was a first diagonal      which was large and normal.  3.  CIRCUMFLEX CORONARY ARTERY:  The circumflex coronary artery in the AV      groove was normal.  There was a mid-obtuse marginal which was large and      normal.  4.  RIGHT CORONARY ARTERY:  The right coronary artery was a large dominant      vessel  and normal.  The posterior descending artery was normal.   LEFT VENTRICULOGRAM:  The left ventriculogram was obtained in the RAO  projection.  The ejection fraction was 10% with global hypokinesis.   CONCLUSION:  Severe non-ischemic cardiomyopathy.  Minimal coronary plaque.   PLAN:  The patient will have aggressive medical management and education for  the treatment of his non-ischemic cardiomyopathy.  He will be counseled to  stop drinking alcohol.       JH/MEDQ  D:  03/23/2005  T:  03/23/2005  Job:  [id number]

## 2011-01-26 NOTE — Discharge Summary (Signed)
Brendan Rogers, Brendan Rogers                ACCOUNT NO.:  [id number]   MEDICAL RECORD NO.:  [id number]          PATIENT TYPE:  INP   LOCATION:  3729                         FACILITY:  [facility]   PHYSICIAN:  Rollene Rotunda, M.D.   DATE OF BIRTH:  Jun 02, 1966   DATE OF ADMISSION:  03/22/2005  DATE OF DISCHARGE:  03/26/2005                                 DISCHARGE SUMMARY   PRINCIPAL DIAGNOSIS:  1.  Acute coronary syndrome/non-ischemic cardiomyopathy.  2.  ETOH abuse.   ALLERGIES:  No known drug allergies.   PROCEDURES:  1.  Left heart cardiac catheterization.  2.  Transthoracic echocardiogram.   HISTORY OF PRESENT ILLNESS:  A 45 year old white male with prior history of  ETOH abuse who for the past month has been experiencing exertional shortness  of breath as well as intermittent chest pain with exertion.  On July 13 at  approximately 4 p.m. while watching T.V. developed sudden onset of burning  chest discomfort with radiation to his left arm followed by an episode of  emesis.  The chest and arm discomfort lasted for almost one hour at rest and  prompted a visit to [facility] Emergency Room where he was stabilized  medically.  An ECG showed nonspecific ST segment changes predominantly noted  in the inferior leads.  His troponin I was elevated at 1.83.  He was  transferred to [facility] for further evaluation of presumed acute coronary  syndrome.   HOSPITAL COURSE:  The patient was admitted to the CCU and was treated with  beta blocker, ACE inhibitor, aspirin, and Statin.  Underwent right and left  heart catheterization on July 14 revealing insignificant coronary artery  disease with an EF of 10% with global hypokinesis.  Right heart pressures  revealed an RA of 32, a PA of 53/22 with a mean of 42 (42) and a PCWP of 40.  It was determined that he would best be managed medically and secondary to  his elevated wedge was diuresed.  He was transferred out to the floor and  his ACE inhibitor was  titrated.  He was tolerating ACE and beta blocker well  and is being discharged home today in satisfactory condition.   DISCHARGE LABORATORIES:  Hemoglobin 12.3, hematocrit 37.2, WBC 6.5,  platelets 253.  Sodium 141, potassium 3.9, chloride 106, CO2 31, BUN 9,  creatinine 1.2, glucose 107, calcium 8.6.  BNP 493.2.  Peak CK 328, peak MB  34.8, peak troponin I 4.37.   DISPOSITION:  Patient is being discharged home in good condition.   FOLLOW-UP:  He has follow-up with Dr. Antoine Poche [facility] on  August 1 at 4 p.m.  He has been advised on the importance of complete  alcohol cessation.   DISCHARGE MEDICATIONS:  All of these medicines are new for the patient.  1.  Enalapril 5 mg q.12h.  2.  Coreg 3.125 mg b.i.d.  3.  Lasix 40 mg daily.  4.  Multivitamin one daily.   OUTSTANDING LABORATORY STUDIES:  None.   DURATION OF DISCHARGE ENCOUNTER:  40 minutes.  [place]   CRB/MEDQ  D:  03/26/2005  T:  03/26/2005  Job:  [id number]

## 2011-01-26 NOTE — Assessment & Plan Note (Signed)
[facility]                          CHRONIC HEART FAILURE NOTE   AXZEL, ROCKHILL                       MRN:          [id number]  DATE:08/19/2006                            DOB:          October 08, 1965    Monday, August 19, 2006   Mr. Thieme returns today for further evaluation and medication  titration of his congestive heart failure which is secondary to  nonischemic cardiomyopathy with EF of less than 10% by cardiac  catheterization July 2006.  Most recent echocardiogram  earlier this  year showed EF of 20-30% with severe diffuse left ventricular  hypokinesis.  Mr. Dall has a history of excessive alcohol  consumption in the past and hypertension in the setting of medical  noncompliance.  He is status post CPX testing in August of 2006 that  showed a VO2 of 31.1 with a VE/VCO2 slope of 30 and O2 pulse oximeter of  100% of predicted.  Mr. Daughdrill has been doing quite well since I saw  him earlier this year.  He continues to tolerate medications without any  problems.  In the past he has been hesitant to proceed with  defibrillator implantation.  I sent him for a T-wave alternans test in  November which was done on July 11, 2006.  Patient had no sustained  alternans and max negative heart rate greater than or equal to 105  consistent with a negative tracing.  I reviewed the results with Mr.  Raatz.  Mr. Tietje, however, continues to drink 1-2 beers a week on  the weekend.  Otherwise, he has been very compliant with medications.   PAST MEDICAL HISTORY:  As stated above.   ALLERGIES:  NO KNOWN DRUG ALLERGIES.   MEDICATIONS:  1. Enalapril 10 mg b.i.d.  2. Bisoprolol/hydrochlorothiazide 10/6.25 mg daily.  3. Multivitamins.  4. Lasix 40 mg daily.   PHYSICAL EXAMINATION:  GENERAL APPEARANCE:  Mr. Balthazor is in no acute  distress.  VITAL SIGNS:  Weight 229 pounds, blood pressure 131/71 with pulse of 69.  HEENT:  Normocephalic  and atraumatic.  NECK:  No jugular venous distension.  LUNGS:  Clear to auscultation bilaterally.  CARDIOVASCULAR:  S1 and S2, regular rate and rhythm.  ABDOMEN:  Soft and nontender with positive bowel sounds.  EXTREMITIES:  Lower extremities without clubbing, cyanosis, or edema.   IMPRESSION:  Stable heart failure at this time. Ejection fraction 20-  30%.  Recent T-wave alternans test negative tracing.  I am going to have  Mr. Devera increase his lisinopril to 20 mg in the a.m. and 10 in the  evening.  Goal will be 40 mg for him.  Will continue bisoprolol as he is  at full dose and Lasix 40 mg daily.  I will see him back in six weeks at  which time I will increase  his enalapril dose again to reach goal for him.  Will recheck BMET today  secondary to ACE inhibitor titration.  Last lab work was done in  September that I have on file here.      Dorian Pod, ACNP  Electronically Signed  Bevelyn Buckles. Bensimhon, MD  Electronically Signed   MB/MedQ  DD: 08/19/2006  DT: 08/20/2006  Job #: [id number]

## 2011-01-26 NOTE — Assessment & Plan Note (Signed)
[facility]                   COUMADIN / CHRONIC HEART FAILURE CLINIC NOTE   [name], [name]                       MRN:          [id number]  DATE:05/10/2006                            DOB:          [birth year]    Brendan Rogers returns today for further evaluation and medication titration  associated with his congestive heart failure secondary to nonischemic  cardiomyopathy with EF of less than 10% by cardiac catheterization in July  2006.  Most recent echocardiogram done in August 2006 showed an EF of less  than 25% with mild mitral regurgitation.  Brendan Rogers also has a history of  alcohol abuse in the past and medical noncompliance and poorly controlled  hypertension.   I had a rather frank discussion with Brendan Rogers on his last visit here in  June regarding his medication and his attitude toward his disease.  Since  that time I think patient has made great improvement in trying to adjust his  lifestyle and take his heart failure more seriously.  He denies any episodes  of orthopnea or PND.  He states he has been feeling quite well, no chest  pain, presyncope or syncopal episode, no peripheral edema. He  continues to  work full time.  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  Without extreme weakness, he is able to continue to  work approximately 50 hours a week.  He states he is compliant with his  medications.   PAST MEDICAL HISTORY:  1. Nonischemic cardiomyopathy.  2. Status post non ST elevated MI in July 2006  3. History of alcohol abuse.   DICTATION ENDED AT THIS POINT.                                 Dorian Pod, ACNP                            Rollene Rotunda, MD, [facility]   MB/MedQ  DD:  05/10/2006  DT:  05/11/2006  Job #:  [id number]

## 2011-01-26 NOTE — Assessment & Plan Note (Signed)
[facility]                          CHRONIC HEART FAILURE NOTE   PERRIN, GENS                       MRN:          [id number]  DATE:10/14/2006                            DOB:          1966/07/07    Mr. Coll returns today for followup regarding his congestive heart  failure, which is secondary to nonischemic cardiomyopathy with an EF of  less than 10% by cardiac catheterization in July, 2006.  Most recent  echocardiogram done in September, 2007 showed an EF of 20-30% with  severe diffuse left ventricular hypokinesis.  Patient also underwent a  recent T wave alternans test that showed no sustained alternans and max  negative heart rate greater than or equal to 105, consistent with a  negative tracing.   Mr. Batdorf states he is doing well.  He continues to work full time.  He continues to consume a couple of beers on the weekend, on Saturdays.  He denies anymore than two beers, 12 ounce cans.  He continues to  tolerate his medications without any problems.   PAST MEDICAL HISTORY:  1. Congestive heart failure secondary to nonischemic cardiomyopathy,      status post non-ST elevated MI in July, 2006.  Cardiac      catheterization at that time revealed an EF of less than 10.      Normal coronaries by cath.  The most recent echocardiogram in      September, 2007 showed an EF of 20-30%.  Patient status post T wave      alternans test, consistent with a negative tracing.  2. History of alcohol abuse.  3. Hypertension.  4. History of medical noncompliance.  5. Status post cardiopulmonary exercise test in August, 2006.  Showed      a VO2 of 31.1 with a VE/VCO2 slope of 30 and an O2 pulse ox of 100%      of predicted.   REVIEW OF SYSTEMS:  As stated above.   CURRENT MEDICATIONS:  Lasix 40 mg daily, multivitamin daily,  bisoprolol/HCT 10/6.25 mg daily, lisinopril 20 mg in the a.m. and 10 in  the evening, KCL 20 mEq daily.   PHYSICAL  EXAMINATION:  VITAL SIGNS:  Weight 230 pounds.  Blood pressure  112/67 with a heart rate of 78.  GENERAL:  Patient in no acute distress.  NECK:  No jugular venous distention at 45 degree angle.  LUNGS:  Clear to auscultation bilaterally.  CARDIOVASCULAR:  An S1 and S2 with a regular rate and rhythm.  ABDOMEN:  Soft and nontender with positive bowel sounds.  EXTREMITIES:  Lower extremities without clubbing, cyanosis or edema.   IMPRESSION:  Stable heart failure at this time.   Will continue current medications.  Will decrease KCL to 10 mEq daily.  Once again, I have reinforced the importance of ETOH discontinuation.  I  will see the patient back in two months, sooner if he has any problems.  Patient's primary cardiologist is Dr. Antoine Poche.  I will arrange for  patient to see Dr. Antoine Poche for routine cardiology visit at his next  appointment.  Patient  has no primary care physician listed.      Dorian Pod, ACNP  Electronically Signed      Rollene Rotunda, MD, [facility]  Electronically Signed   MB/MedQ  DD: 10/14/2006  DT: 10/14/2006  Job #: [id number]

## 2011-04-17 ENCOUNTER — Encounter: Payer: Self-pay | Admitting: Cardiology

## 2011-05-15 ENCOUNTER — Encounter: Payer: Self-pay | Admitting: Cardiology

## 2011-05-15 ENCOUNTER — Ambulatory Visit (INDEPENDENT_AMBULATORY_CARE_PROVIDER_SITE_OTHER): Payer: Managed Care, Other (non HMO) | Admitting: Cardiology

## 2011-05-15 DIAGNOSIS — I509 Heart failure, unspecified: Secondary | ICD-10-CM

## 2011-05-15 DIAGNOSIS — I10 Essential (primary) hypertension: Secondary | ICD-10-CM

## 2011-05-15 MED ORDER — LISINOPRIL 20 MG PO TABS: 20.0000 mg | ORAL_TABLET | Freq: Two times a day (BID) | ORAL | Status: DC

## 2011-05-15 MED ORDER — BISOPROLOL-HYDROCHLOROTHIAZIDE 10-6.25 MG PO TABS: 1.0000 | ORAL_TABLET | Freq: Every day | ORAL | Status: DC

## 2011-05-15 NOTE — Assessment & Plan Note (Signed)
His blood pressure is well-controlled. He can continue the meds as listed.

## 2011-05-15 NOTE — Patient Instructions (Signed)
The current medical regimen is effective;  continue present plan and medications.  Follow up in 1 year with Dr Hochrein.  You will receive a letter in the mail 2 months before you are due.  Please call us when you receive this letter to schedule your follow up appointment.  

## 2011-05-15 NOTE — Progress Notes (Signed)
HPI The patient presents for one year follow up.  Since I last saw him he has done well. The patient denies any new symptoms such as chest discomfort, neck or arm discomfort. There has been no new shortness of breath, PND or orthopnea. There have been no reported palpitations, presyncope or syncope.  He swims daily.  He does drink some alcohol only on the weekends.  No Known Allergies  Current Outpatient Prescriptions  Medication Sig Dispense Refill  . aspirin 81 MG tablet Take 81 mg by mouth daily.        . bisoprolol-hydrochlorothiazide (ZIAC) 10-6.25 MG per tablet Take 1 tablet by mouth daily.        Marland Kitchen lisinopril (PRINIVIL,ZESTRIL) 20 MG tablet Take 20 mg by mouth 2 (two) times daily.        . multivitamin (THERAGRAN) per tablet Take 2 tablets by mouth daily.        . Omega-3 Fatty Acids (FISH OIL) 300 MG CAPS Take 2 capsules by mouth daily.        . Red Yeast Rice Extract POWD Take by mouth daily.          Past Medical History  Diagnosis Date  . CHF (congestive heart failure)     secondary to nonischemic cardiomyopathy, EF normal by echcardiogram  . History of ETOH abuse   . Hypertension   . Dyslipidemia     No past surgical history on file.  ROS: As stated in the HPI and negative for all other systems.  PHYSICAL EXAM BP 119/82  Pulse 61  Resp 16  Ht 5\' 7"  (1.702 m)  Wt 236 lb (107.049 kg)  BMI 36.96 kg/m2 GENERAL:  Well appearing HEENT:  Pupils equal round and reactive, fundi not visualized, oral mucosa unremarkable NECK:  No jugular venous distention, waveform within normal limits, carotid upstroke brisk and symmetric, no bruits, no thyromegaly LYMPHATICS:  No cervical, inguinal adenopathy LUNGS:  Clear to auscultation bilaterally BACK:  No CVA tenderness CHEST:  Unremarkable HEART:  PMI not displaced or sustained,S1 and S2 within normal limits, no S3, no S4, no clicks, no rubs, no murmurs ABD:  Flat, positive bowel sounds normal in frequency in pitch, no bruits, no  rebound, no guarding, no midline pulsatile mass, no hepatomegaly, no splenomegaly EXT:  2 plus pulses throughout, no edema, no cyanosis no clubbing SKIN:  No rashes no nodules NEURO:  Cranial nerves II through XII grossly intact, motor grossly intact throughout PSYCH:  Cognitively intact, oriented to person place and time  EKG:  Sinus rhythm, rate 64, axis within normal limits, T-wave inversions in the inferior leads slightly more pronounced than previous  ASSESSMENT AND PLAN

## 2011-05-15 NOTE — Assessment & Plan Note (Signed)
He is completely asymptomatic and very active. I do not think further cardiovascular testing is indicated. I told him he should avoid all alcohol.

## 2012-06-14 ENCOUNTER — Other Ambulatory Visit: Payer: Self-pay | Admitting: Cardiology

## 2015-07-10 ENCOUNTER — Emergency Department (HOSPITAL_COMMUNITY)
Admission: EM | Admit: 2015-07-10 | Discharge: 2015-07-10 | Disposition: A | Discharge status: Home / Self Care | Payer: Managed Care, Other (non HMO) | Attending: Emergency Medicine | Admitting: Emergency Medicine

## 2015-07-10 ENCOUNTER — Encounter (HOSPITAL_COMMUNITY): Payer: Self-pay | Admitting: Emergency Medicine

## 2015-07-10 ENCOUNTER — Emergency Department (HOSPITAL_COMMUNITY): Payer: Managed Care, Other (non HMO)

## 2015-07-10 DIAGNOSIS — I10 Essential (primary) hypertension: Secondary | ICD-10-CM

## 2015-07-10 DIAGNOSIS — Z79899 Other long term (current) drug therapy: Secondary | ICD-10-CM | POA: Insufficient documentation

## 2015-07-10 DIAGNOSIS — Z91148 Patient's other noncompliance with medication regimen for other reason: Secondary | ICD-10-CM

## 2015-07-10 DIAGNOSIS — Z9119 Patient's noncompliance with other medical treatment and regimen: Secondary | ICD-10-CM | POA: Insufficient documentation

## 2015-07-10 DIAGNOSIS — I5023 Acute on chronic systolic (congestive) heart failure: Secondary | ICD-10-CM

## 2015-07-10 DIAGNOSIS — E785 Hyperlipidemia, unspecified: Secondary | ICD-10-CM | POA: Insufficient documentation

## 2015-07-10 DIAGNOSIS — Z7982 Long term (current) use of aspirin: Secondary | ICD-10-CM | POA: Insufficient documentation

## 2015-07-10 DIAGNOSIS — Z9114 Patient's other noncompliance with medication regimen: Secondary | ICD-10-CM

## 2015-07-10 DIAGNOSIS — R0600 Dyspnea, unspecified: Secondary | ICD-10-CM

## 2015-07-10 LAB — COMPREHENSIVE METABOLIC PANEL
ALBUMIN: 4 g/dL (ref 3.5–5.0)
ALK PHOS: 44 U/L (ref 38–126)
ALT: 35 U/L (ref 17–63)
ANION GAP: 8 (ref 5–15)
AST: 32 U/L (ref 15–41)
BILIRUBIN TOTAL: 0.6 mg/dL (ref 0.3–1.2)
BUN: 18 mg/dL (ref 6–20)
CALCIUM: 9.5 mg/dL (ref 8.9–10.3)
CO2: 25 mmol/L (ref 22–32)
CREATININE: 0.9 mg/dL (ref 0.61–1.24)
Chloride: 104 mmol/L (ref 101–111)
GFR calc Af Amer: >60 mL/min (ref >60)
GFR calc non Af Amer: >60 mL/min (ref >60)
GLUCOSE: 101 mg/dL — AB (ref 65–99)
Potassium: 3.7 mmol/L (ref 3.5–5.1)
SODIUM: 137 mmol/L (ref 135–145)
TOTAL PROTEIN: 7.6 g/dL (ref 6.5–8.1)

## 2015-07-10 LAB — CBC WITH DIFFERENTIAL/PLATELET
BASOS PCT: 0 %
Basophils Absolute: 0 10*3/uL (ref 0.0–0.1)
Eosinophils Absolute: 0.2 10*3/uL (ref 0.0–0.7)
Eosinophils Relative: 2 %
HEMATOCRIT: 39 % (ref 39.0–52.0)
HEMOGLOBIN: 13 g/dL (ref 13.0–17.0)
Lymphocytes Relative: 34 %
Lymphs Abs: 2.7 10*3/uL (ref 0.7–4.0)
MCH: 28.7 pg (ref 26.0–34.0)
MCHC: 33.3 g/dL (ref 30.0–36.0)
MCV: 86.1 fL (ref 78.0–100.0)
MONOS PCT: 8 %
Monocytes Absolute: 0.6 10*3/uL (ref 0.1–1.0)
NEUTROS ABS: 4.4 10*3/uL (ref 1.7–7.7)
NEUTROS PCT: 56 %
Platelets: 242 10*3/uL (ref 150–400)
RBC: 4.53 MIL/uL (ref 4.22–5.81)
RDW: 14.4 % (ref 11.5–15.5)
WBC: 7.9 10*3/uL (ref 4.0–10.5)

## 2015-07-10 LAB — BRAIN NATRIURETIC PEPTIDE: B Natriuretic Peptide: 193 pg/mL — ABNORMAL HIGH (ref 0.0–100.0)

## 2015-07-10 MED ORDER — FUROSEMIDE 20 MG PO TABS: 20.0000 mg | ORAL_TABLET | Freq: Every day | ORAL | Status: DC

## 2015-07-10 MED ORDER — LISINOPRIL 10 MG PO TABS: 10.0000 mg | ORAL_TABLET | Freq: Every day | ORAL | Status: DC

## 2015-07-10 MED ORDER — FUROSEMIDE 40 MG PO TABS
40.0000 mg | ORAL_TABLET | Freq: Once | ORAL | Status: AC
  Administered 2015-07-10: 40 mg via ORAL
  Filled 2015-07-10: qty 1

## 2015-07-10 NOTE — ED Provider Notes (Addendum)
CSN: [id number]     Arrival date & time 07/10/15  1503 History   First MD Initiated Contact with Patient 07/10/15 1647     Chief Complaint  Patient presents with  . Shortness of Breath     (Consider location/radiation/quality/duration/timing/severity/associated sxs/prior Treatment) HPI Comments: 49 year old male with history of high blood pressure, alcohol cardiomyopathy, heart failure, noncompliance of medications presents with intermittent shortness of breath both with lying flat and sometimes with exertion worsening for the past few weeks. Patient is not taken medicines for over a year. Patient does not follow-up. No active chest pain or shortness of breath during exam. No heart attack history. No fevers or cough.  Patient is a 49 y.o. male presenting with shortness of breath. The history is provided by the patient.  Shortness of Breath Associated symptoms: cough (chronic)   Associated symptoms: no abdominal pain, no chest pain, no fever, no headaches, no neck pain, no rash and no vomiting     Past Medical History  Diagnosis Date  . CHF (congestive heart failure) (HCC)     secondary to nonischemic cardiomyopathy, EF normal by echcardiogram  . History of ETOH abuse   . Hypertension   . Dyslipidemia    History reviewed. No pertinent past surgical history. Family History  Problem Relation Age of Onset  . Coronary artery disease Other    Social History  Substance Use Topics  . Smoking status: Never Smoker   . Smokeless tobacco: None  . Alcohol Use: Yes     Comment: regularly, including 3 shots a day    Review of Systems  Constitutional: Negative for fever and chills.  HENT: Negative for congestion.   Eyes: Negative for visual disturbance.  Respiratory: Positive for cough (chronic) and shortness of breath.   Cardiovascular: Negative for chest pain.  Gastrointestinal: Negative for vomiting and abdominal pain.  Genitourinary: Negative for dysuria and flank pain.   Musculoskeletal: Negative for back pain, neck pain and neck stiffness.  Skin: Negative for rash.  Neurological: Negative for light-headedness and headaches.      Allergies  Review of patient's allergies indicates no known allergies.  Home Medications   Prior to Admission medications   Medication Sig Start Date End Date Taking? Authorizing Provider  aspirin 81 MG tablet Take 81 mg by mouth daily.     Yes Historical Provider, MD  multivitamin [facility]) per tablet Take 2 tablets by mouth daily.     Yes Historical Provider, MD  Omega-3 Fatty Acids (FISH OIL) 300 MG CAPS Take 2 capsules by mouth daily.     Yes Historical Provider, MD  Red Yeast Rice Extract (RED YEAST RICE PO) Take 1 tablet by mouth daily.   Yes Historical Provider, MD  bisoprolol-hydrochlorothiazide (ZIAC) 10-6.25 MG per tablet Take 1 tablet by mouth daily. Patient not taking: Reported on 07/10/2015 05/15/11   Rollene Rotunda, MD  furosemide (LASIX) 20 MG tablet Take 1 tablet (20 mg total) by mouth daily. 07/10/15   Blane Ohara, MD  lisinopril (PRINIVIL,ZESTRIL) 10 MG tablet Take 1 tablet (10 mg total) by mouth daily. 07/10/15   Blane Ohara, MD   BP 144/119 mmHg  Pulse 105  Temp(Src) 97.7 F (36.5 C) (Oral)  Resp 20  Ht 5\' 7"  (1.702 m)  Wt 235 lb (106.595 kg)  BMI 36.80 kg/m2  SpO2 100% Physical Exam  Constitutional: He is oriented to person, place, and time. He appears well-developed and well-nourished.  HENT:  Head: Normocephalic and atraumatic.  Eyes: Conjunctivae are normal.  Right eye exhibits no discharge. Left eye exhibits no discharge.  Neck: Normal range of motion. Neck supple. No tracheal deviation present.  Cardiovascular: Normal rate and regular rhythm.   Pulmonary/Chest: Effort normal and breath sounds normal.  Abdominal: Soft. He exhibits no distension (fullness upper). There is no tenderness. There is no guarding.  Musculoskeletal: He exhibits no edema.  Neurological: He is alert and oriented  to person, place, and time.  Skin: Skin is warm. No rash noted.  Psychiatric: He has a normal mood and affect.  Nursing note and vitals reviewed.   ED Course  Procedures (including critical care time) Labs Review Labs Reviewed  COMPREHENSIVE METABOLIC PANEL - Abnormal; Notable for the following:    Glucose, Bld 101 (*)    All other components within normal limits  BRAIN NATRIURETIC PEPTIDE - Abnormal; Notable for the following:    B Natriuretic Peptide 193.0 (*)    All other components within normal limits  CBC WITH DIFFERENTIAL/PLATELET    Imaging Review Dg Chest 2 View  07/10/2015  CLINICAL DATA:  Shortness of breath for 2 weeks. History of congestive heart failure. Upper abdominal pain and distention. Initial encounter. EXAM: CHEST  2 VIEW COMPARISON:  Radiographs 03/22/2005. FINDINGS: Moderate cardiomegaly appears stable. There is central venous congestion and possible mild interstitial edema. There is no confluent airspace opacity or significant pleural effusion. The bones appear unremarkable. IMPRESSION: Cardiomegaly with chronic venous congestion and possible mild interstitial edema. Electronically Signed   By: Carey Bullocks M.D.   On: 07/10/2015 15:47   I have personally reviewed and evaluated these images and lab results as part of my medical decision-making.   EKG Interpretation None      MDM   Final diagnoses:  Acute on chronic systolic congestive heart failure (HCC)  Dyspnea  Essential hypertension  Non compliance w medication regimen   Patient presents with concern for acute on chronic heart failure. With fullness and upper abdomen, orthopnea and heart failure history along with noncompliance with blood pressure medicines. Noncompliance discussed in detail. Discussed strict reasons to return and importance of follow-up with primary doctor as well as cardiologist. Patient says he will comply and follow-up. Lasix given in the ER. Blood pressure elevated as expected  with presentation. Patient denies PE risk factors. Results and differential diagnosis were discussed with the patient/parent/guardian. Xrays were independently reviewed by myself.  Close follow up outpatient was discussed, comfortable with the plan.   Medications  furosemide (LASIX) tablet 40 mg (40 mg Oral Given 07/10/15 1732)    Filed Vitals:   07/10/15 1506 07/10/15 1732  BP: 163/96 144/119  Pulse: 116 105  Temp: 97.7 F (36.5 C)   TempSrc: Oral   Resp: 24 20  Height:  (1.702 m)   Weight: 235 lb (106.595 kg)   SpO2: 99% 100%    Final diagnoses:  Acute on chronic systolic congestive heart failure (HCC)  Dyspnea  Essential hypertension  Non compliance w medication regimen       Blane Ohara, MD 07/10/15 1743  Blane Ohara, MD 07/10/15 1744

## 2015-07-10 NOTE — ED Notes (Signed)
PT c/o SOB on exertion x2 weeks and c/o abdominal distention as well. PT denies any chest pain or n/v/d. PT also states he has not taking his medications in over a year.

## 2015-07-10 NOTE — Discharge Instructions (Signed)
Follow-up closely with cardiology in your primary doctor. Please take her medicines and follow-up with primary doctor. If you were given medicines take as directed.  If you are on coumadin or contraceptives realize their levels and effectiveness is altered by many different medicines.  If you have any reaction (rash, tongues swelling, other) to the medicines stop taking and see a physician.    If your blood pressure was elevated in the ER make sure you follow up for management with a primary doctor or return for chest pain, shortness of breath or stroke symptoms.  Please follow up as directed and return to the ER or see a physician for new or worsening symptoms.  Thank you. Filed Vitals:   07/10/15 1506 07/10/15 1732  BP: 163/96 144/119  Pulse: 116 105  Temp: 97.7 F (36.5 C)   TempSrc: Oral   Resp: 24 20  Height: 5\' 7"  (1.702 m)   Weight: 235 lb (106.595 kg)   SpO2: 99% 100%

## 2016-01-03 ENCOUNTER — Inpatient Hospital Stay (HOSPITAL_COMMUNITY)
Admission: EM | Admit: 2016-01-03 | Discharge: 2016-01-07 | DRG: 292 | Disposition: A | Discharge status: Home / Self Care | Payer: Managed Care, Other (non HMO) | Attending: Internal Medicine | Admitting: Internal Medicine

## 2016-01-03 ENCOUNTER — Emergency Department (HOSPITAL_COMMUNITY): Payer: Self-pay

## 2016-01-03 ENCOUNTER — Encounter (HOSPITAL_COMMUNITY): Payer: Self-pay | Admitting: Emergency Medicine

## 2016-01-03 DIAGNOSIS — I110 Hypertensive heart disease with heart failure: Principal | ICD-10-CM | POA: Diagnosis present

## 2016-01-03 DIAGNOSIS — Z6832 Body mass index (BMI) 32.0-32.9, adult: Secondary | ICD-10-CM

## 2016-01-03 DIAGNOSIS — E785 Hyperlipidemia, unspecified: Secondary | ICD-10-CM | POA: Diagnosis not present

## 2016-01-03 DIAGNOSIS — I426 Alcoholic cardiomyopathy: Secondary | ICD-10-CM | POA: Diagnosis present

## 2016-01-03 DIAGNOSIS — F1010 Alcohol abuse, uncomplicated: Secondary | ICD-10-CM | POA: Diagnosis present

## 2016-01-03 DIAGNOSIS — I255 Ischemic cardiomyopathy: Secondary | ICD-10-CM | POA: Diagnosis present

## 2016-01-03 DIAGNOSIS — R079 Chest pain, unspecified: Secondary | ICD-10-CM | POA: Diagnosis present

## 2016-01-03 DIAGNOSIS — I10 Essential (primary) hypertension: Secondary | ICD-10-CM | POA: Diagnosis present

## 2016-01-03 DIAGNOSIS — I5022 Chronic systolic (congestive) heart failure: Secondary | ICD-10-CM

## 2016-01-03 DIAGNOSIS — E669 Obesity, unspecified: Secondary | ICD-10-CM | POA: Diagnosis present

## 2016-01-03 DIAGNOSIS — Z8249 Family history of ischemic heart disease and other diseases of the circulatory system: Secondary | ICD-10-CM

## 2016-01-03 DIAGNOSIS — E1165 Type 2 diabetes mellitus with hyperglycemia: Secondary | ICD-10-CM | POA: Diagnosis present

## 2016-01-03 DIAGNOSIS — I472 Ventricular tachycardia: Secondary | ICD-10-CM | POA: Diagnosis not present

## 2016-01-03 DIAGNOSIS — Z9114 Patient's other noncompliance with medication regimen: Secondary | ICD-10-CM

## 2016-01-03 DIAGNOSIS — Z7982 Long term (current) use of aspirin: Secondary | ICD-10-CM

## 2016-01-03 DIAGNOSIS — I509 Heart failure, unspecified: Secondary | ICD-10-CM | POA: Diagnosis not present

## 2016-01-03 DIAGNOSIS — I5023 Acute on chronic systolic (congestive) heart failure: Secondary | ICD-10-CM | POA: Diagnosis present

## 2016-01-03 LAB — BASIC METABOLIC PANEL
ANION GAP: 9 (ref 5–15)
BUN: 17 mg/dL (ref 6–20)
CALCIUM: 9.4 mg/dL (ref 8.9–10.3)
CHLORIDE: 106 mmol/L (ref 101–111)
CO2: 24 mmol/L (ref 22–32)
CREATININE: 1.17 mg/dL (ref 0.61–1.24)
GFR calc Af Amer: >60 mL/min (ref >60)
GFR calc non Af Amer: >60 mL/min (ref >60)
GLUCOSE: 99 mg/dL (ref 65–99)
Potassium: 3.7 mmol/L (ref 3.5–5.1)
Sodium: 139 mmol/L (ref 135–145)

## 2016-01-03 LAB — CBC
HCT: 42.4 % (ref 39.0–52.0)
HEMOGLOBIN: 14 g/dL (ref 13.0–17.0)
MCH: 27.8 pg (ref 26.0–34.0)
MCHC: 33 g/dL (ref 30.0–36.0)
MCV: 84.3 fL (ref 78.0–100.0)
PLATELETS: 232 10*3/uL (ref 150–400)
RBC: 5.03 MIL/uL (ref 4.22–5.81)
RDW: 15 % (ref 11.5–15.5)
WBC: 7 10*3/uL (ref 4.0–10.5)

## 2016-01-03 LAB — I-STAT TROPONIN, ED: Troponin i, poc: 0.04 ng/mL (ref 0.00–0.08)

## 2016-01-03 LAB — TROPONIN I: TROPONIN I: 0.03 ng/mL (ref <0.031)

## 2016-01-03 LAB — BRAIN NATRIURETIC PEPTIDE: B Natriuretic Peptide: 569.9 pg/mL — ABNORMAL HIGH (ref 0.0–100.0)

## 2016-01-03 LAB — D-DIMER, QUANTITATIVE (NOT AT ARMC)

## 2016-01-03 MED ORDER — FUROSEMIDE 10 MG/ML IJ SOLN
40.0000 mg | Freq: Two times a day (BID) | INTRAMUSCULAR | Status: DC
  Administered 2016-01-04 – 2016-01-05 (×3): 40 mg via INTRAVENOUS
  Filled 2016-01-03 (×3): qty 4

## 2016-01-03 MED ORDER — ASPIRIN EC 81 MG PO TBEC
81.0000 mg | DELAYED_RELEASE_TABLET | Freq: Every day | ORAL | Status: DC
  Administered 2016-01-04 – 2016-01-05 (×2): 81 mg via ORAL
  Filled 2016-01-03 (×3): qty 1

## 2016-01-03 MED ORDER — ACETAMINOPHEN 325 MG PO TABS: 650.0000 mg | ORAL_TABLET | ORAL | Status: DC | PRN

## 2016-01-03 MED ORDER — ADULT MULTIVITAMIN W/MINERALS CH
1.0000 | ORAL_TABLET | Freq: Every day | ORAL | Status: DC
  Administered 2016-01-04 – 2016-01-06 (×3): 1 via ORAL
  Filled 2016-01-03 (×3): qty 1

## 2016-01-03 MED ORDER — ONDANSETRON HCL 4 MG/2ML IJ SOLN: 4.0000 mg | Freq: Four times a day (QID) | INTRAMUSCULAR | Status: DC | PRN

## 2016-01-03 MED ORDER — SODIUM CHLORIDE 0.9% FLUSH
3.0000 mL | Freq: Two times a day (BID) | INTRAVENOUS | Status: DC
  Administered 2016-01-03 – 2016-01-07 (×8): 3 mL via INTRAVENOUS

## 2016-01-03 MED ORDER — POTASSIUM CHLORIDE CRYS ER 10 MEQ PO TBCR
10.0000 meq | EXTENDED_RELEASE_TABLET | Freq: Two times a day (BID) | ORAL | Status: DC
  Administered 2016-01-03 – 2016-01-05 (×4): 10 meq via ORAL
  Filled 2016-01-03 (×4): qty 1

## 2016-01-03 MED ORDER — FUROSEMIDE 10 MG/ML IJ SOLN
40.0000 mg | Freq: Once | INTRAMUSCULAR | Status: AC
  Administered 2016-01-03: 40 mg via INTRAVENOUS
  Filled 2016-01-03: qty 4

## 2016-01-03 MED ORDER — ENOXAPARIN SODIUM 40 MG/0.4ML ~~LOC~~ SOLN
40.0000 mg | SUBCUTANEOUS | Status: DC
  Administered 2016-01-03 – 2016-01-05 (×3): 40 mg via SUBCUTANEOUS
  Filled 2016-01-03 (×3): qty 0.4

## 2016-01-03 MED ORDER — SODIUM CHLORIDE 0.9% FLUSH: 3.0000 mL | INTRAVENOUS | Status: DC | PRN

## 2016-01-03 MED ORDER — SODIUM CHLORIDE 0.9 % IV SOLN: 250.0000 mL | INTRAVENOUS | Status: DC | PRN

## 2016-01-03 MED ORDER — LISINOPRIL 2.5 MG PO TABS
2.5000 mg | ORAL_TABLET | Freq: Every day | ORAL | Status: DC
  Administered 2016-01-03 – 2016-01-05 (×3): 2.5 mg via ORAL
  Filled 2016-01-03 (×3): qty 1

## 2016-01-03 NOTE — ED Notes (Signed)
Pt arrives via POV from home with SOB that began a few weeks ago. Yesterday states developed chest pains while working. Pt reports increased SOB at night and weakness. VSS.

## 2016-01-03 NOTE — ED Provider Notes (Signed)
CSN: [id number]     Arrival date & time 01/03/16  1228 History   First MD Initiated Contact with Patient 01/03/16 1720     Chief Complaint  Patient presents with  . Shortness of Breath  . Chest Pain     (Consider location/radiation/quality/duration/timing/severity/associated sxs/prior Treatment) HPI Comments: Patient presents with chest pain shortness of breath. He has a history of hypertension, hyperlipidemia and nonischemic cardiomyopathy. His last echocardiogram showed a normal EF. This is several years ago. He states over the last 3 weeks he's had worsening shortness of breath. It seems to be more exertional. He does have some at night but it doesn't seem to be true orthopnea. He has had 2 episodes of associated chest pain. The most recently just her today. He's been feeling more fatigued than normal. He's had a decreased appetite and some intermittent nausea and diaphoresis. He feels like a shortness of breath may be related to fumes that he encounters at work. He denies any significant cough or chest congestion. No fevers. He was previously on lisinopril and Ziac.  However he doesn't have insurance and he's been out of his medicines for about 2 years. He denies any leg swelling or calf tenderness. He does make frequent trips to [place].  He's a nonsmoker but does drink alcohol on occasion. He states on Sunday which was 2 days ago, he drank 2 shots and a beer. He denies any alcohol ingestion since that time.  Patient is a 50 y.o. male presenting with shortness of breath and chest pain.  Shortness of Breath Associated symptoms: chest pain   Associated symptoms: no abdominal pain, no cough, no diaphoresis, no fever, no headaches, no rash and no vomiting   Chest Pain Associated symptoms: fatigue, nausea and shortness of breath   Associated symptoms: no abdominal pain, no back pain, no cough, no diaphoresis, no dizziness, no fever, no headache, no numbness, not vomiting and no weakness      Past Medical History  Diagnosis Date  . CHF (congestive heart failure) (HCC)     secondary to nonischemic cardiomyopathy, EF normal by echcardiogram  . History of ETOH abuse   . Hypertension   . Dyslipidemia    History reviewed. No pertinent past surgical history. Family History  Problem Relation Age of Onset  . Coronary artery disease Other    Social History  Substance Use Topics  . Smoking status: Never Smoker   . Smokeless tobacco: None  . Alcohol Use: Yes     Comment: regularly, including 3 shots a day    Review of Systems  Constitutional: Positive for fatigue. Negative for fever, chills and diaphoresis.  HENT: Negative for congestion, rhinorrhea and sneezing.   Eyes: Negative.   Respiratory: Positive for shortness of breath. Negative for cough and chest tightness.   Cardiovascular: Positive for chest pain. Negative for leg swelling.  Gastrointestinal: Positive for nausea. Negative for vomiting, abdominal pain, diarrhea and blood in stool.  Genitourinary: Negative for frequency, hematuria, flank pain and difficulty urinating.  Musculoskeletal: Negative for back pain and arthralgias.  Skin: Negative for rash.  Neurological: Negative for dizziness, speech difficulty, weakness, numbness and headaches.      Allergies  Review of patient's allergies indicates no known allergies.  Home Medications   Prior to Admission medications   Medication Sig Start Date End Date Taking? Authorizing Provider  aspirin 81 MG tablet Take 81 mg by mouth daily.     Yes Historical Provider, MD  multivitamin [facility]) per tablet Take  1 tablet by mouth at bedtime.    Yes Historical Provider, MD  Omega-3 Fatty Acids (FISH OIL) 300 MG CAPS Take 1 capsule by mouth at bedtime.    Yes Historical Provider, MD  bisoprolol-hydrochlorothiazide (ZIAC) 10-6.25 MG per tablet Take 1 tablet by mouth daily. Patient not taking: Reported on 07/10/2015 05/15/11   Rollene Rotunda, MD  furosemide (LASIX) 20  MG tablet Take 1 tablet (20 mg total) by mouth daily. Patient not taking: Reported on 01/03/2016 07/10/15   Blane Ohara, MD  lisinopril (PRINIVIL,ZESTRIL) 10 MG tablet Take 1 tablet (10 mg total) by mouth daily. Patient not taking: Reported on 01/03/2016 07/10/15   Blane Ohara, MD   BP 100/86 mmHg  Pulse 89  Temp(Src) 97.8 F (36.6 C) (Oral)  Resp 27  SpO2 98% Physical Exam  Constitutional: He is oriented to person, place, and time. He appears well-developed and well-nourished.  HENT:  Head: Normocephalic and atraumatic.  Eyes: Pupils are equal, round, and reactive to light.  Neck: Normal range of motion. Neck supple.  Cardiovascular: Normal rate and regular rhythm.   Murmur heard. Pulmonary/Chest: Effort normal and breath sounds normal. No respiratory distress. He has no wheezes. He has no rales. He exhibits no tenderness.  Abdominal: Soft. Bowel sounds are normal. There is no tenderness. There is no rebound and no guarding.  Musculoskeletal: Normal range of motion. He exhibits no edema.  No edema or calf tenderness  Lymphadenopathy:    He has no cervical adenopathy.  Neurological: He is alert and oriented to person, place, and time.  Skin: Skin is warm and dry. No rash noted.  Psychiatric: He has a normal mood and affect.    ED Course  Procedures (including critical care time) Labs Review Labs Reviewed  BRAIN NATRIURETIC PEPTIDE - Abnormal; Notable for the following:    B Natriuretic Peptide 569.9 (*)    All other components within normal limits  BASIC METABOLIC PANEL  CBC  D-DIMER, QUANTITATIVE (NOT AT [facility])  Rosezena Sensor, ED    Imaging Review Dg Chest 2 View  01/03/2016  CLINICAL DATA:  Shortness of breath starting of a few weeks ago. EXAM: CHEST  2 VIEW COMPARISON:  July 10, 2015 FINDINGS: The heart size and mediastinal contours are stable. Heart size is enlarged. There is enlargement of central pulmonary vessel unchanged compared prior exam. There is no  focal pneumonia, frank edema or pleural effusion. The visualized skeletal structures are stable. IMPRESSION: Stable mild central pulmonary vascular congestion probably chronic. No pulmonary edema or focal pneumonia. Electronically Signed   By: Sherian Rein M.D.   On: 01/03/2016 13:17   I have personally reviewed and evaluated these images and lab results as part of my medical decision-making.   EKG Interpretation   Date/Time:  Tuesday January 03 2016 17:32:37 EDT Ventricular Rate:  103 PR Interval:  181 QRS Duration: 101 QT Interval:  368 QTC Calculation: 482 R Axis:   -28 Text Interpretation:  Sinus tachycardia Left atrial enlargement Left  ventricular hypertrophy Borderline prolonged QT interval since last  tracing no significant change Confirmed by Kamalei Roeder  MD, Teretha Chalupa ([id number]) on  01/03/2016 5:40:27 PM      MDM   Final diagnoses:  Acute on chronic systolic congestive heart failure (HCC)    Patient presents with worsening shortness of breath associated with some intermittent chest pain. He is currently symptom-free. His EKG doesn't show any ischemic changes. His troponin is negative. His BNP is elevated. He was given dose of Lasix  and aspirin in the ED. His d-dimer is negative and there is no other suggestions of pulmonary embolus. I discussed the case with the hospitalist, Dr. Shari Heritage, who will admit the pt to telemetry, obs.    Rolan Bucco, MD 01/03/16 [phone]

## 2016-01-03 NOTE — ED Notes (Signed)
Hospitalist at the bedside 

## 2016-01-03 NOTE — H&P (Signed)
History and Physical    Brendan Rogers ZOX:[id number] DOB: 13-May-1966 DOA: 01/03/2016  Referring Provider: Dr. Fredderick Phenix (EDP)  PCP: Kirstie Peri, MD  Outpatient Specialists: Dr. Antoine Poche (cardiolgoy; has not seen in at least 2 yrs)  Patient coming from: Home   Chief Complaint: SOB  HPI: Brendan Rogers is a 50 y.o. male with medical history significant for hypertension, alcohol abuse in remission, hyperlipidemia, and nonischemic cardiomyopathy who presents to the ED with several days of progressive dyspnea. Patient reports insidious onset of dyspnea roughly one week ago, prior to which he had been in his usual state of fairly good health. He previously followed with cardiology before losing his insurance and was noted to have insignificant coronary disease on cath in July 2006, but with EF of only 10% and global hypokinesis. Patient was medically managed, and by time of echo in 2008, systolic function had normalized. Patient has not seen a physician in at least 2 years, citing lack of insurance, but has been taking a daily baby aspirin and fish oil. He denies fevers, chills, or cough. He denies unilateral leg swelling or tenderness. He denies chest pain per se, but describes a vague chest discomfort which was fleeting and occurred 2 days ago. His dyspnea is not significantly worsened with exertion, but he wakes with dyspnea nightly. He denies orthopnea initially, but will awaken in the middle the night gasping for breath. These episodes subside after several minutes and he returns to sleep. He has not noticed any significant weight gain and denies lower extremity edema.  ED Course: Upon arrival to the ED, patient is found to be afebrile, saturating well on room air, tachycardic in the low 100s, and with vital signs otherwise stable. EKG features a sinus tachycardia with rate 102 and LVH by voltage criteria. Initial troponin is in the normal range at 0.04. BNP is elevated to 570 and chest x-ray demonstrates  central pulmonary vascular congestion. D-dimer was obtained by the ED PEN returns negative at <0.27. BMP and CBC indices are within their normal ranges. Lasix 40 mg IV push was administered in the emergency department, the patient remained hemodynamically stable and without chest pain, and he will be admitted to the hospital for ongoing evaluation and management of dyspnea suspected secondary to acute CHF.  Review of Systems:  All other systems reviewed and apart from HPI, are negative.  Past Medical History  Diagnosis Date  . CHF (congestive heart failure) (HCC)     secondary to nonischemic cardiomyopathy, EF normal by echcardiogram  . History of ETOH abuse   . Hypertension   . Dyslipidemia     History reviewed. No pertinent past surgical history.   reports that he has never smoked. He does not have any smokeless tobacco history on file. He reports that he drinks alcohol. His drug history is not on file.  No Known Allergies  Family History  Problem Relation Age of Onset  . Coronary artery disease Other      Prior to Admission medications   Medication Sig Start Date End Date Taking? Authorizing Provider  aspirin 81 MG tablet Take 81 mg by mouth daily.     Yes Historical Provider, MD  multivitamin [facility]) per tablet Take 1 tablet by mouth at bedtime.    Yes Historical Provider, MD  Omega-3 Fatty Acids (FISH OIL) 300 MG CAPS Take 1 capsule by mouth at bedtime.    Yes Historical Provider, MD  bisoprolol-hydrochlorothiazide (ZIAC) 10-6.25 MG per tablet Take 1 tablet by mouth  daily. Patient not taking: Reported on 07/10/2015 05/15/11   Rollene Rotunda, MD  furosemide (LASIX) 20 MG tablet Take 1 tablet (20 mg total) by mouth daily. Patient not taking: Reported on 01/03/2016 07/10/15   Blane Ohara, MD  lisinopril (PRINIVIL,ZESTRIL) 10 MG tablet Take 1 tablet (10 mg total) by mouth daily. Patient not taking: Reported on 01/03/2016 07/10/15   Blane Ohara, MD    Physical Exam: Filed  Vitals:   01/03/16 1438 01/03/16 1908 01/03/16 1915 01/03/16 1930  BP: 107/74 106/87 112/80 100/86  Pulse: 96 92 95 89  Temp:  97.8 F (36.6 C)    TempSrc:  Oral    Resp: 18 20 23 27   SpO2: 100% 98% 99% 98%      Constitutional: NAD, calm, comfortable Eyes: PERTLA, lids and conjunctivae normal ENMT: Mucous membranes are moist. Posterior pharynx clear of any exudate or lesions.  Neck: normal, supple, no masses, no thyromegaly Respiratory: clear to auscultation bilaterally, no wheezing. Normal respiratory effort. No accessory muscle use.  Cardiovascular: S1 & S2 heard, regular rate and rhythm, soft systolic murmur at apex, no rubs / gallops. No carotid bruits.  Abdomen: No distension, no tenderness, no masses palpated. No hepatosplenomegaly. Bowel sounds normal.  Musculoskeletal: no clubbing / cyanosis. No joint deformity upper and lower extremities. Trace pretibial edema b/l.  Skin: no rashes, lesions, ulcers. No induration Neurologic: CN 2-12 grossly intact. Sensation intact, DTR normal. Strength 5/5 in all 4 limbs.  Psychiatric: Normal judgment and insight. Alert and oriented x 3. Normal mood.     Labs on Admission: I have personally reviewed following labs and imaging studies  CBC:  Recent Labs Lab 01/03/16 1237  WBC 7.0  HGB 14.0  HCT 42.4  MCV 84.3  PLT 232   Basic Metabolic Panel:  Recent Labs Lab 01/03/16 1237  NA 139  K 3.7  CL 106  CO2 24  GLUCOSE 99  BUN 17  CREATININE 1.17  CALCIUM 9.4   GFR: CrCl cannot be calculated (Unknown ideal weight.). Liver Function Tests: No results for input(s): AST, ALT, ALKPHOS, BILITOT, PROT, ALBUMIN in the last 168 hours. No results for input(s): LIPASE, AMYLASE in the last 168 hours. No results for input(s): AMMONIA in the last 168 hours. Coagulation Profile: No results for input(s): INR, PROTIME in the last 168 hours. Cardiac Enzymes: No results for input(s): CKTOTAL, CKMB, CKMBINDEX, TROPONINI in the last 168  hours. BNP (last 3 results) No results for input(s): PROBNP in the last 8760 hours. HbA1C: No results for input(s): HGBA1C in the last 72 hours. CBG: No results for input(s): GLUCAP in the last 168 hours. Lipid Profile: No results for input(s): CHOL, HDL, LDLCALC, TRIG, CHOLHDL, LDLDIRECT in the last 72 hours. Thyroid Function Tests: No results for input(s): TSH, T4TOTAL, FREET4, T3FREE, THYROIDAB in the last 72 hours. Anemia Panel: No results for input(s): VITAMINB12, FOLATE, FERRITIN, TIBC, IRON, RETICCTPCT in the last 72 hours. Urine analysis: No results found for: COLORURINE, APPEARANCEUR, LABSPEC, PHURINE, GLUCOSEU, HGBUR, BILIRUBINUR, KETONESUR, PROTEINUR, UROBILINOGEN, NITRITE, LEUKOCYTESUR Sepsis Labs: @LABRCNTIP (procalcitonin:4,lacticidven:4) )No results found for this or any previous visit (from the past 240 hour(s)).   Radiological Exams on Admission: Dg Chest 2 View  01/03/2016  CLINICAL DATA:  Shortness of breath starting of a few weeks ago. EXAM: CHEST  2 VIEW COMPARISON:  July 10, 2015 FINDINGS: The heart size and mediastinal contours are stable. Heart size is enlarged. There is enlargement of central pulmonary vessel unchanged compared prior exam. There is no focal  pneumonia, frank edema or pleural effusion. The visualized skeletal structures are stable. IMPRESSION: Stable mild central pulmonary vascular congestion probably chronic. No pulmonary edema or focal pneumonia. Electronically Signed   By: Sherian Rein M.D.   On: 01/03/2016 13:17    EKG: Independently reviewed. Sinus tachycardia (rate 102), LVH by voltage criteria   Assessment/Plan  1. Acute CHF  - Presents with orthopnea, PND, and dyspnea; BNP elevated to 570 and CXR features pulm vasc congestion  - Previously had EF as low as 10% in July 2006; diagnosed with non-ischemic CM suspected secondary to alcohol abuse at that time  - Most recent echo (03/08/07) with normal EF and wall motion, no significant  diastolic or valvular disease - Lasix 40 mg IV x1 given in ED and pt has already had impressive diuresis and improvement in sxs  - SLIV, strict I/Os, daily wts, fluid-restrict diet  - Update TTE, ordered    2. Hypertension - At goal currently  - Not currently taking medications for this at home  - Monitor, initiate treatment prn    3. Hyperlipidemia   - LDL 185, HDL 47 in December 2010  - Pt has been taking fish oil at home since losing insurance; previously on Lipitor  - Check fasting lipid panel in am    DVT prophylaxis: sq Lovenox  Code Status: Full  Family Communication: Significant other at bedside  Disposition Plan: Admit to telemetry  Consults called: None  Admission status: Observation    Briscoe Deutscher MD [facility] Pager [phone]  If 7PM-7AM, please contact night-coverage [email or web address] Password [email or web address]  01/03/2016, 8:21 PM

## 2016-01-04 ENCOUNTER — Observation Stay (HOSPITAL_COMMUNITY): Payer: Managed Care, Other (non HMO)

## 2016-01-04 ENCOUNTER — Observation Stay (HOSPITAL_BASED_OUTPATIENT_CLINIC_OR_DEPARTMENT_OTHER): Payer: Managed Care, Other (non HMO)

## 2016-01-04 DIAGNOSIS — I509 Heart failure, unspecified: Secondary | ICD-10-CM

## 2016-01-04 DIAGNOSIS — I10 Essential (primary) hypertension: Secondary | ICD-10-CM | POA: Diagnosis not present

## 2016-01-04 DIAGNOSIS — E785 Hyperlipidemia, unspecified: Secondary | ICD-10-CM | POA: Diagnosis not present

## 2016-01-04 DIAGNOSIS — I5023 Acute on chronic systolic (congestive) heart failure: Secondary | ICD-10-CM | POA: Diagnosis not present

## 2016-01-04 LAB — LIPID PANEL
CHOL/HDL RATIO: 5.2 ratio
Cholesterol: 178 mg/dL (ref 0–200)
HDL: 34 mg/dL — AB (ref >40)
LDL Cholesterol: 113 mg/dL — ABNORMAL HIGH (ref 0–99)
TRIGLYCERIDES: 154 mg/dL — AB (ref <150)
VLDL: 31 mg/dL (ref 0–40)

## 2016-01-04 LAB — BASIC METABOLIC PANEL
Anion gap: 8 (ref 5–15)
BUN: 18 mg/dL (ref 6–20)
CHLORIDE: 107 mmol/L (ref 101–111)
CO2: 26 mmol/L (ref 22–32)
CREATININE: 1.12 mg/dL (ref 0.61–1.24)
Calcium: 9.1 mg/dL (ref 8.9–10.3)
GFR calc non Af Amer: >60 mL/min (ref >60)
Glucose, Bld: 149 mg/dL — ABNORMAL HIGH (ref 65–99)
POTASSIUM: 4 mmol/L (ref 3.5–5.1)
SODIUM: 141 mmol/L (ref 135–145)

## 2016-01-04 LAB — ECHOCARDIOGRAM COMPLETE
Height: 67 in
WEIGHTICAEL: 3416 [oz_av]

## 2016-01-04 LAB — TROPONIN I: TROPONIN I: 0.03 ng/mL (ref <0.031)

## 2016-01-04 MED ORDER — ADULT MULTIVITAMIN W/MINERALS CH: 1.0000 | ORAL_TABLET | Freq: Every day | ORAL | Status: DC

## 2016-01-04 MED ORDER — FOLIC ACID 1 MG PO TABS
1.0000 mg | ORAL_TABLET | Freq: Every day | ORAL | Status: DC
  Administered 2016-01-04 – 2016-01-07 (×4): 1 mg via ORAL
  Filled 2016-01-04 (×4): qty 1

## 2016-01-04 MED ORDER — PRAVASTATIN SODIUM 20 MG PO TABS
10.0000 mg | ORAL_TABLET | Freq: Every day | ORAL | Status: DC
  Administered 2016-01-04 – 2016-01-05 (×2): 10 mg via ORAL
  Filled 2016-01-04 (×2): qty 1

## 2016-01-04 MED ORDER — THIAMINE HCL 100 MG/ML IJ SOLN
100.0000 mg | Freq: Every day | INTRAMUSCULAR | Status: DC
  Filled 2016-01-04: qty 2

## 2016-01-04 MED ORDER — VITAMIN B-1 100 MG PO TABS
100.0000 mg | ORAL_TABLET | Freq: Every day | ORAL | Status: DC
  Administered 2016-01-04 – 2016-01-07 (×4): 100 mg via ORAL
  Filled 2016-01-04 (×4): qty 1

## 2016-01-04 MED ORDER — LORAZEPAM 2 MG/ML IJ SOLN: 1.0000 mg | Freq: Four times a day (QID) | INTRAMUSCULAR | Status: DC | PRN

## 2016-01-04 MED ORDER — LORAZEPAM 1 MG PO TABS: 1.0000 mg | ORAL_TABLET | Freq: Four times a day (QID) | ORAL | Status: DC | PRN

## 2016-01-04 NOTE — Progress Notes (Signed)
  Echocardiogram 2D Echocardiogram has been performed.  Cathie Beams 01/04/2016, 2:28 PM

## 2016-01-04 NOTE — Progress Notes (Signed)
[facility]                                                                              Patient Demographics  Brendan Rogers, is a 50 y.o. male, DOB - October 31, 1965, WUJ:[id number]  Admit date - 01/03/2016   Admitting Physician Briscoe Deutscher, MD  Outpatient Primary MD for the patient is [facility], MD  Outpatient specialists: Dr. Antoine Poche with cardiology but has not seen over 2 years  LOS -   days    Chief Complaint  Patient presents with  . Shortness of Breath  . Chest Pain       Brief summary   Patient is a 50 year old male with hypertension, alcohol abuse, hyperlipidemia, nonischemic cardiomyopathy presented to ED with several days of progressive dyspnea for last 1 week. He has not followed up with cardiology for over 2 years as he lost insurance. He denied any leg swelling or chest pain however had shortness of breath, wakes with dyspnea nightly, feels short of breath at work. He denied any significant weight gain. No hypoxia and ED, tachycardia and low 100s otherwise stable, BNP elevated at 570 with chest x-ray showing pulmonary vascular congestion, d-dimer negative.  Assessment & Plan    Principal Problem: Acute On chronic systolic CHF : Presented with orthopnea, PND, and dyspnea; BNP elevated to 570 and CXR features pulm vasc congestion.Previously had EF as low as 10% in July 2006; diagnosed with non-ischemic CM suspected secondary to alcohol abuse at that time. Echo (03/08/07) however showed normal EF 55-60%  -  S/p Lasix 40 mg IV x1 given in ED with improvement in the symptoms. Continue IV Lasix daily - Negative balance of 3.8 L, weight 215lbs on admission, currently 213 - Follow 2-D echo, troponin 3 negative  Hypertension - BP currently on lower side, monitor with diuresis.  -  2-D echo pending   Hyperlipidemia  - Fasting lipid panel showed LDL 113, triglycerides 154 - Consulted on low-fat diet, place on lovastatin ($4 [employer] list)  History of  alcohol abuse - Counseled strongly on alcohol cessation, placed on alcohol withdrawal protocol, last drink on Sunday, 3 days ago  Hyperglycemia Follow hemoglobin A1c  Code Status: Full CODE STATUS  Family Communication: Discussed in detail with the patient, all imaging results, lab results explained to the patient  Disposition Plan: Pending workup  Time Spent in minutes   25 minutes  Procedures  None   Consults   none   DVT Prophylaxis  Lovenox   Medications  Scheduled Meds: . aspirin EC  81 mg Oral Daily  . enoxaparin (LOVENOX) injection  40 mg Subcutaneous Q24H  . furosemide  40 mg Intravenous Q12H  . lisinopril  2.5 mg Oral Daily  . multivitamin with minerals  1 tablet Oral QHS  . potassium chloride  10 mEq Oral BID  . sodium chloride flush  3 mL Intravenous Q12H   Continuous Infusions:  PRN Meds:.sodium chloride, acetaminophen, ondansetron (ZOFRAN) IV, sodium chloride flush   Antibiotics   Anti-infectives    None        Subjective:   Brendan Rogers was seen and examined today.  Currently feeling better, no complaints. Patient denies dizziness, chest pain, shortness of breath, abdominal pain, N/V/D/C, new weakness, numbess, tingling.   Objective:   Filed Vitals:   01/03/16 2122 01/04/16 0013 01/04/16 0454 01/04/16 1015  BP: 121/86 111/65 99/72 103/74  Pulse: 97 95 98 97  Temp: 97.8 F (36.6 C) 97.5 F (36.4 C) 97.8 F (36.6 C)   TempSrc: Oral Oral Oral   Resp: 20 20 20    Height: 5\' 7"  (1.702 m)     Weight: 97.75 kg (215 lb 8 oz)  96.843 kg (213 lb 8 oz)   SpO2: 100% 99% 99%     Intake/Output Summary (Last 24 hours) at 01/04/16 1139 Last data filed at 01/04/16 0900  Gross per 24 hour  Intake    840 ml  Output   4715 ml  Net  -3875 ml     Wt Readings from Last 3 Encounters:  01/04/16 96.843 kg (213 lb 8 oz)  07/10/15 106.595 kg (235 lb)  05/15/11 107.049 kg (236 lb)     Exam  General: Alert and oriented x 3, NAD  HEENT:  PERRLA,  EOMI, Anicteric Sclera, mucous membranes moist.   Neck: Supple, no JVD, no masses  CVS: S1 S2 auscultated, Regular rate and rhythm.  Respiratory: Clear to auscultation bilaterally, no wheezing, rales or rhonchi  Abdomen: Soft, nontender, nondistended, + bowel sounds  Ext: no cyanosis clubbing, 1+ edema  Neuro: AAOx3, Cr N's II- XII. Strength 5/5 upper and lower extremities bilaterally  Skin: No rashes  Psych: Normal affect and demeanor, alert and oriented x3    Data Reviewed:  I have personally reviewed following labs and imaging studies  Micro Results No results found for this or any previous visit (from the past 240 hour(s)).  Radiology Reports Dg Chest 2 View  01/03/2016  CLINICAL DATA:  Shortness of breath starting of a few weeks ago. EXAM: CHEST  2 VIEW COMPARISON:  July 10, 2015 FINDINGS: The heart size and mediastinal contours are stable. Heart size is enlarged. There is enlargement of central pulmonary vessel unchanged compared prior exam. There is no focal pneumonia, frank edema or pleural effusion. The visualized skeletal structures are stable. IMPRESSION: Stable mild central pulmonary vascular congestion probably chronic. No pulmonary edema or focal pneumonia. Electronically Signed   By: Sherian Rein M.D.   On: 01/03/2016 13:17    CBC  Recent Labs Lab 01/03/16 1237  WBC 7.0  HGB 14.0  HCT 42.4  PLT 232  MCV 84.3  MCH 27.8  MCHC 33.0  RDW 15.0    Chemistries   Recent Labs Lab 01/03/16 1237 01/04/16 0221  NA 139 141  K 3.7 4.0  CL 106 107  CO2 24 26  GLUCOSE 99 149*  BUN 17 18  CREATININE 1.17 1.12  CALCIUM 9.4 9.1   ------------------------------------------------------------------------------------------------------------------ estimated creatinine clearance is 87.5 mL/min (by C-G formula based on Cr of 1.12). ------------------------------------------------------------------------------------------------------------------ No results for  input(s): HGBA1C in the last 72 hours. ------------------------------------------------------------------------------------------------------------------  Recent Labs  01/04/16 0221  CHOL 178  HDL 34*  LDLCALC 113*  TRIG 154*  CHOLHDL 5.2   ------------------------------------------------------------------------------------------------------------------ No results for input(s): TSH, T4TOTAL, T3FREE, THYROIDAB in the last 72 hours.  Invalid input(s): FREET3 ------------------------------------------------------------------------------------------------------------------ No results for input(s): VITAMINB12, FOLATE, FERRITIN, TIBC, IRON, RETICCTPCT in the last 72 hours.  Coagulation profile No results for input(s): INR, PROTIME in the last 168 hours.   Recent Labs  01/03/16 1756  DDIMER <0.27    Cardiac Enzymes  Recent Labs Lab 01/03/16 2134 01/04/16 0221 01/04/16 0816  TROPONINI 0.03 <0.03 0.03   ------------------------------------------------------------------------------------------------------------------ Invalid input(s): POCBNP  No results for input(s): GLUCAP in the last 72 hours.   Santana Edell M.D. [facility] 01/04/2016, 11:39 AM  Pager: [phone] Between 7am to 7pm - call Pager - [phone]  After 7pm go to [email or web address] - password [email or web address]  Call night coverage person covering after 7pm

## 2016-01-05 DIAGNOSIS — E785 Hyperlipidemia, unspecified: Secondary | ICD-10-CM | POA: Diagnosis not present

## 2016-01-05 DIAGNOSIS — I5023 Acute on chronic systolic (congestive) heart failure: Secondary | ICD-10-CM | POA: Diagnosis not present

## 2016-01-05 DIAGNOSIS — I10 Essential (primary) hypertension: Secondary | ICD-10-CM | POA: Diagnosis not present

## 2016-01-05 DIAGNOSIS — F1010 Alcohol abuse, uncomplicated: Secondary | ICD-10-CM | POA: Diagnosis not present

## 2016-01-05 DIAGNOSIS — Z9114 Patient's other noncompliance with medication regimen: Secondary | ICD-10-CM

## 2016-01-05 DIAGNOSIS — I509 Heart failure, unspecified: Secondary | ICD-10-CM | POA: Diagnosis not present

## 2016-01-05 LAB — BASIC METABOLIC PANEL
ANION GAP: 8 (ref 5–15)
BUN: 20 mg/dL (ref 6–20)
CO2: 29 mmol/L (ref 22–32)
Calcium: 9.2 mg/dL (ref 8.9–10.3)
Chloride: 104 mmol/L (ref 101–111)
Creatinine, Ser: 1.42 mg/dL — ABNORMAL HIGH (ref 0.61–1.24)
GFR, EST NON AFRICAN AMERICAN: 56 mL/min — AB (ref >60)
GLUCOSE: 116 mg/dL — AB (ref 65–99)
POTASSIUM: 4 mmol/L (ref 3.5–5.1)
Sodium: 141 mmol/L (ref 135–145)

## 2016-01-05 LAB — HEMOGLOBIN A1C
Hgb A1c MFr Bld: 6.4 % — ABNORMAL HIGH (ref 4.8–5.6)
MEAN PLASMA GLUCOSE: 137 mg/dL

## 2016-01-05 MED ORDER — FUROSEMIDE 10 MG/ML IJ SOLN: 40.0000 mg | Freq: Every day | INTRAMUSCULAR | Status: DC

## 2016-01-05 MED ORDER — DIGOXIN 125 MCG PO TABS
0.1250 mg | ORAL_TABLET | Freq: Every day | ORAL | Status: DC
  Administered 2016-01-05 – 2016-01-07 (×3): 0.125 mg via ORAL
  Filled 2016-01-05 (×3): qty 1

## 2016-01-05 MED ORDER — LISINOPRIL 2.5 MG PO TABS
2.5000 mg | ORAL_TABLET | Freq: Two times a day (BID) | ORAL | Status: DC
  Administered 2016-01-05: 2.5 mg via ORAL
  Filled 2016-01-05 (×2): qty 1

## 2016-01-05 MED ORDER — POTASSIUM CHLORIDE CRYS ER 10 MEQ PO TBCR
10.0000 meq | EXTENDED_RELEASE_TABLET | Freq: Every day | ORAL | Status: DC
  Administered 2016-01-06 – 2016-01-07 (×2): 10 meq via ORAL
  Filled 2016-01-05 (×2): qty 1

## 2016-01-05 MED ORDER — FUROSEMIDE 10 MG/ML IJ SOLN
40.0000 mg | Freq: Two times a day (BID) | INTRAMUSCULAR | Status: DC
  Administered 2016-01-05 – 2016-01-07 (×4): 40 mg via INTRAVENOUS
  Filled 2016-01-05 (×4): qty 4

## 2016-01-05 MED ORDER — SPIRONOLACTONE 25 MG PO TABS
12.5000 mg | ORAL_TABLET | Freq: Every day | ORAL | Status: DC
  Administered 2016-01-05: 12.5 mg via ORAL
  Filled 2016-01-05 (×2): qty 1

## 2016-01-05 NOTE — Research (Signed)
[email or web address]  Informed Consent   Subject Name: Brendan Rogers  Subject met inclusion and exclusion criteria.  The informed consent form, study requirements and expectations were reviewed with the subject and questions and concerns were addressed prior to the signing of the consent form.  The subject verbalized understanding of the trail requirements.  The subject agreed to participate in the [email or web address]  trial and signed the informed consent.  The informed consent was obtained prior to performance of any protocol-specific procedures for the subject.  A copy of the signed informed consent was given to the subject and a copy was placed in the subject's medical record.  [name] 01/05/2016, 8:15

## 2016-01-05 NOTE — Progress Notes (Signed)
Heart Failure Navigator Consult Note  Presentation: Brendan Rogers is a 50 y.o. male with medical history significant for hypertension, alcohol abuse in remission, hyperlipidemia, and nonischemic cardiomyopathy who presents to the ED with several days of progressive dyspnea. Patient reports insidious onset of dyspnea roughly one week ago, prior to which he had been in his usual state of fairly good health. He previously followed with cardiology before losing his insurance and was noted to have insignificant coronary disease on cath in July 2006, but with EF of only 10% and global hypokinesis. Patient was medically managed, and by time of echo in 2008, systolic function had normalized. Patient has not seen a physician in at least 2 years, citing lack of insurance, but has been taking a daily baby aspirin and fish oil. He denies fevers, chills, or cough. He denies unilateral leg swelling or tenderness. He denies chest pain per se, but describes a vague chest discomfort which was fleeting and occurred 2 days ago. His dyspnea is not significantly worsened with exertion, but he wakes with dyspnea nightly. He denies orthopnea initially, but will awaken in the middle the night gasping for breath. These episodes subside after several minutes and he returns to sleep. He has not noticed any significant weight gain and denies lower extremity edema.   Past Medical History  Diagnosis Date  . CHF (congestive heart failure) (HCC)     secondary to nonischemic cardiomyopathy, EF normal by echcardiogram  . History of ETOH abuse   . Hypertension   . Dyslipidemia     Social History   Social History  . Marital Status: Divorced    Spouse Name: N/A  . Number of Children: N/A  . Years of Education: N/A   Social History Main Topics  . Smoking status: Never Smoker   . Smokeless tobacco: None  . Alcohol Use: Yes     Comment: regularly, including 3 shots a day  . Drug Use: None  . Sexual Activity: Not Asked    Other Topics Concern  . None   Social History Narrative    ECHO:  01/04/16 Study Conclusions  - Left ventricle: The cavity size was moderately dilated. Systolic  function was severely reduced. The estimated ejection fraction  was in the range of 20% to 25%. There is akinesis of the  anteroseptal myocardium. - Mitral valve: There was moderate regurgitation. - Left atrium: The atrium was moderately dilated. - Right ventricle: The cavity size was mildly dilated. Wall  thickness was normal. - Right atrium: The atrium was moderately dilated. - Tricuspid valve: There was moderate regurgitation. - Pulmonary arteries: Systolic pressure was moderately increased.  PA peak pressure: 51 mm Hg (S).  Impressions:  - EF previously in 02/10/07 was 55%  Transthoracic echocardiography. M-mode, complete 2D, spectral Doppler, and color Doppler. Birthdate: Patient birthdate: July 22, 1966. Age: Patient is 50 yr old. Sex: Gender: male. BMI: 33.4 kg/m^2. Blood pressure: 103/74 Patient status: Inpatient. Study date: Study date: 01/04/2016. Study time: 01:47 PM. Location: Echo laboratory.  BNP    Component Value Date/Time   BNP 569.9* 01/03/2016 1747    ProBNP    Component Value Date/Time   PROBNP 15.0 08/30/2009 1545     Education Assessment and Provision:  Detailed education and instructions provided on heart failure disease management including the following:  Signs and symptoms of Heart Failure When to call the physician Importance of daily weights Low sodium diet Fluid restriction Medication management Anticipated future follow-up appointments  Patient education given on each  of the above topics.  Patient acknowledges understanding and acceptance of all instructions.  I spoke at length with Brendan Rogers regarding his current admission and HF diagnosis.  He admits that he has not been taking his medications after losing his insurance months ago.  He does  not weigh and I have discussed with him the importance of daily weights and how they relate to the signs and symptoms of HF.  His girlfriend tells me that he loves salt and currently does nothing to restrict sodium in his diet.  I reviewed a low sodium diet and the need to restrict sodium to 2000 mg per day.  I discussed high sodium foods to avoid.  He will require assistance with medications at discharge and was very concerned about leaving the hospital today.  I have reinforced the need to take all prescribed medications daily.  He lives in [place] with his mother.  Education Materials:  "Living Better With Heart Failure" Booklet, Daily Weight Tracker Tool    High Risk Criteria for Readmission and/or Poor Patient Outcomes: (Recommend Follow-up with [facility])--yes he will need outpatient follow-up with the [facility] Clinic for ongoing education, medication assistance and LCSW referrral for ongoing financial concerns.   EF <30%- yes 20-25%  2 or more admissions in 6 months- No  Difficult social situation- No  Demonstrates medication noncompliance- Yes- has not taken medications in months due to lack of insurance coverage    Barriers of Care:  Health Literacy, Knowledge of HF, Compliance and finances  Discharge Planning:   Plans to return to home with mother in [place]

## 2016-01-05 NOTE — Consult Note (Signed)
Advanced Heart Failure Team Consult Note  Referring Physician: Dr Rod Can Primary Physician: Kirstie Peri, MD Primary Cardiologist:  Previously Dr. Antoine Poche (> 2 years)  Reason for Consultation:  A/C systolic CHF   HPI:    Brendan Rogers is a 50 y.o. male with hx of NICM, HTN, HLD, and alcohol abuse who presented to ED with worsening DOE x 1 week. Had not seen cards in > 2 years with lapse in insurance.   Pertinent labs on admission include  K 3.7, Creatinine 1.17, BNP 569.9, Troponin 0.03 without trend.  Pt reported EF 10% in June 2006, cath with insignificant CAD. Increased to 55-60% in 2008 by echo with medical management.   He has since stopped all medicine except for baby ASA and fish oil.  Echo (01/04/16) LVEF 20-25% with akinesis of the anteroseptal myocardium. Mod MR. Mod LAE. Mildly dilated RV. Mod dilated RA, Mod TR, PA peak pressure 51 mm Hg.   Out 4.5 L so far this admission and wt down 5 lbs.  Creatinine bump slightly from 1.1 => 1.4 this am.   He does not weight daily.  Loves salt, does not restrict. Has been off medicines for at least 3 months with no insurance. Drinks several shots and beers when he is off work.  Doesn't drink on days he works (from 2-3 to 6 days a week).  Heavy drinker on the weekends. Swims 5-6 times a week (up to 15 laps).  Swam as recently as Monday without difficulty.  Has been sleeping better with IV lasix.   Review of Systems: [y] = yes, [ ]  = no   General: Weight gain [ ] ; Weight loss [ ] ; Anorexia [ ] ; Fatigue [ ] ; Fever [ ] ; Chills [ ] ; Weakness [ ]   Cardiac: Chest pain/pressure [ ] ; Resting SOB [ ] ; Exertional SOB [ ] ; Orthopnea [ ] ; Pedal Edema [ ] ; Palpitations [ ] ; Syncope [ ] ; Presyncope [ ] ; Paroxysmal nocturnal dyspnea[ ]   Pulmonary: Cough [ ] ; Wheezing[ ] ; Hemoptysis[ ] ; Sputum [ ] ; Snoring [ ]   GI: Vomiting[ ] ; Dysphagia[ ] ; Melena[ ] ; Hematochezia [ ] ; Heartburn[ ] ; Abdominal pain [ ] ; Constipation [ ] ; Diarrhea [ ] ; BRBPR [ ]   GU:  Hematuria[ ] ; Dysuria [ ] ; Nocturia[ ]   Vascular: Pain in legs with walking [ ] ; Pain in feet with lying flat [ ] ; Non-healing sores [ ] ; Stroke [ ] ; TIA [ ] ; Slurred speech [ ] ;  Neuro: Headaches[ ] ; Vertigo[ ] ; Seizures[ ] ; Paresthesias[ ] ;Blurred vision [ ] ; Diplopia [ ] ; Vision changes [ ]   Ortho/Skin: Arthritis [ ] ; Joint pain [ ] ; Muscle pain [ ] ; Joint swelling [ ] ; Back Pain [ ] ; Rash [ ]   Psych: Depression[ ] ; Anxiety[ ]   Heme: Bleeding problems [ ] ; Clotting disorders [ ] ; Anemia [ ]   Endocrine: Diabetes [ ] ; Thyroid dysfunction[ ]   Home Medications Prior to Admission medications   Medication Sig Start Date End Date Taking? Authorizing Provider  aspirin 81 MG tablet Take 81 mg by mouth daily.     Yes Historical Provider, MD  multivitamin [facility]) per tablet Take 1 tablet by mouth at bedtime.    Yes Historical Provider, MD  Omega-3 Fatty Acids (FISH OIL) 300 MG CAPS Take 1 capsule by mouth at bedtime.    Yes Historical Provider, MD  bisoprolol-hydrochlorothiazide (ZIAC) 10-6.25 MG per tablet Take 1 tablet by mouth daily. Patient not taking: Reported on 07/10/2015 05/15/11   Rollene Rotunda, MD  furosemide (  LASIX) 20 MG tablet Take 1 tablet (20 mg total) by mouth daily. Patient not taking: Reported on 01/03/2016 07/10/15   Blane Ohara, MD  lisinopril (PRINIVIL,ZESTRIL) 10 MG tablet Take 1 tablet (10 mg total) by mouth daily. Patient not taking: Reported on 01/03/2016 07/10/15   Blane Ohara, MD    Past Medical History: Past Medical History  Diagnosis Date  . CHF (congestive heart failure) (HCC)     secondary to nonischemic cardiomyopathy, EF normal by echcardiogram  . History of ETOH abuse   . Hypertension   . Dyslipidemia     Past Surgical History: History reviewed. No pertinent past surgical history.  Family History: Family History  Problem Relation Age of Onset  . Coronary artery disease Other     Social History: Social History   Social History  . Marital  Status: Divorced    Spouse Name: N/A  . Number of Children: N/A  . Years of Education: N/A   Social History Main Topics  . Smoking status: Never Smoker   . Smokeless tobacco: None  . Alcohol Use: Yes     Comment: regularly, including 3 shots a day  . Drug Use: None  . Sexual Activity: Not Asked   Other Topics Concern  . None   Social History Narrative    Allergies:  No Known Allergies  Objective:    Vital Signs:   Temp:  [97.4 F (36.3 C)-98 F (36.7 C)] 98 F (36.7 C) (04/27 0544) Pulse Rate:  [94-102] 100 (04/27 0544) Resp:  [16-20] 16 (04/27 0544) BP: (100-107)/(52-90) 107/90 mmHg (04/27 0544) SpO2:  [98 %-100 %] 100 % (04/27 0544) Weight:  [210 lb 1.6 oz (95.301 kg)] 210 lb 1.6 oz (95.301 kg) (04/27 0544) Last BM Date: 01/05/16  Weight change: Filed Weights   01/03/16 2122 01/04/16 0454 01/05/16 0544  Weight: 215 lb 8 oz (97.75 kg) 213 lb 8 oz (96.843 kg) 210 lb 1.6 oz (95.301 kg)    Intake/Output:   Intake/Output Summary (Last 24 hours) at 01/05/16 1111 Last data filed at 01/05/16 0715  Gross per 24 hour  Intake    880 ml  Output   1775 ml  Net   -895 ml     Physical Exam: General:  Well appearing. No resp difficulty HEENT: normal Neck: supple. JVP 9 cm. Carotids 2+ bilat; no bruits. No lymphadenopathy or thyromegaly appreciated. Cor: PMI nondisplaced. Regular rhythm, slightly tachy..  +S3, 2/6 MR Lungs: CTAB, normal effort Abdomen: soft, nontender, mild/mod distended. No hepatosplenomegaly. No bruits or masses. Good bowel sounds. Extremities: no cyanosis, clubbing, rash, edema. Warm, not cool. Neuro: alert & orientedx3, cranial nerves grossly intact. moves all 4 extremities w/o difficulty. Affect pleasant  Telemetry: Reviewed, NSR  Labs: Basic Metabolic Panel:  Recent Labs Lab 01/03/16 1237 01/04/16 0221 01/05/16 0329  NA 139 141 141  K 3.7 4.0 4.0  CL 106 107 104  CO2 24 26 29   GLUCOSE 99 149* 116*  BUN 17 18 20   CREATININE 1.17  1.12 1.42*  CALCIUM 9.4 9.1 9.2    Liver Function Tests: No results for input(s): AST, ALT, ALKPHOS, BILITOT, PROT, ALBUMIN in the last 168 hours. No results for input(s): LIPASE, AMYLASE in the last 168 hours. No results for input(s): AMMONIA in the last 168 hours.  CBC:  Recent Labs Lab 01/03/16 1237  WBC 7.0  HGB 14.0  HCT 42.4  MCV 84.3  PLT 232    Cardiac Enzymes:  Recent Labs Lab 01/03/16 2134 01/04/16  0221 01/04/16 0816  TROPONINI 0.03 <0.03 0.03    BNP: BNP (last 3 results)  Recent Labs  07/10/15 1554 01/03/16 1747  BNP 193.0* 569.9*    ProBNP (last 3 results) No results for input(s): PROBNP in the last 8760 hours.   CBG: No results for input(s): GLUCAP in the last 168 hours.  Coagulation Studies: No results for input(s): LABPROT, INR in the last 72 hours.  Other results: EKG: 01/03/16 Sinus tachycardia 102 bpm, biatrial enlargement, LVH  Imaging: Dg Chest 2 View  01/03/2016  CLINICAL DATA:  Shortness of breath starting of a few weeks ago. EXAM: CHEST  2 VIEW COMPARISON:  July 10, 2015 FINDINGS: The heart size and mediastinal contours are stable. Heart size is enlarged. There is enlargement of central pulmonary vessel unchanged compared prior exam. There is no focal pneumonia, frank edema or pleural effusion. The visualized skeletal structures are stable. IMPRESSION: Stable mild central pulmonary vascular congestion probably chronic. No pulmonary edema or focal pneumonia. Electronically Signed   By: Sherian Rein M.D.   On: 01/03/2016 13:17      Medications:     Current Medications: . aspirin EC  81 mg Oral Daily  . enoxaparin (LOVENOX) injection  40 mg Subcutaneous Q24H  . folic acid  1 mg Oral Daily  . [START ON 01/06/2016] furosemide  40 mg Intravenous Daily  . lisinopril  2.5 mg Oral Daily  . multivitamin with minerals  1 tablet Oral QHS  . potassium chloride  10 mEq Oral BID  . pravastatin  10 mg Oral q1800  . sodium chloride  flush  3 mL Intravenous Q12H  . thiamine  100 mg Oral Daily   Or  . thiamine  100 mg Intravenous Daily     Infusions:      Assessment   1. A/C systolic CHF due to NICM EF 20-25% 2. HTN 3. HLD 4. ETOH abuse - on CIWA 5. DM2 - per primary 6. Obesity  Plan    Reduce in EF likely due to his heavy drinking. Do not suspect ischemic CM at this point with lack of ACS symptoms and previously insignificant CAD. He remains volume overloaded on exam.   Stressed importance of complete alcohol cessation.   Start dig 0.125 and spiro 12.5.  Continue lasix 40 mg IV BID for now.   Increase lisinopril to 2.5 mg BID.   Needs a faxed work not today or he will lose his job. [phone]  Length of Stay: 1  Graciella Freer PA-C 01/05/2016, 11:11 AM  Advanced Heart Failure Team Pager [phone] (M-F; 7a - 4p)  Please contact [facility] for night-coverage after hours (4p -7a ) and weekends on [email or web address]  Patient seen and examined with Otilio Saber, PA-C. We discussed all aspects of the encounter. I agree with the assessment and plan as stated above.   Patient well known to me from previous enrollment in the HF Clinic. EF back down now in setting of medication noncompliance and ETOH abuse. Echo reviewed personally EF 20-25%. Improved with diuresis. However, on exam neck veins still up and still quite tachycardic with an s3. Would keep 1-2 more days for medication titration. Add digoxin, spiro and increase lisinopril. No b-blocker yet with possible low output. Agree with lasix 40 IV bid. Long talk about need for medication compliance and complete ETOH cessation. We will provide him with medications at discharge.    Obert Espindola,MD 2:40 PM

## 2016-01-05 NOTE — Progress Notes (Signed)
Work note faxed to pts temp agency at [phone]. Discussed with Sports administrator and copy given to pt/family.     Casimiro Needle [address] [place], PA-C 01/05/2016 1:36 PM

## 2016-01-05 NOTE — Progress Notes (Signed)
[facility]                                                                              Patient Demographics  Brendan Rogers, is a 50 y.o. male, DOB - [birth year], ZLD:[id number]  Admit date - 01/03/2016   Admitting Physician Briscoe Deutscher, MD  Outpatient Primary MD for the patient is [name], [facility]  Outpatient specialists: Dr. Antoine Poche with cardiology but has not seen over 2 years  LOS - 1  days    Chief Complaint  Patient presents with  . Shortness of Breath  . Chest Pain       Brief summary   Patient is a 50 year old male with hypertension, alcohol abuse, hyperlipidemia, nonischemic cardiomyopathy presented to ED with several days of progressive dyspnea for last 1 week. He has not followed up with cardiology for over 2 years as he lost insurance. He denied any leg swelling or chest pain however had shortness of breath, wakes with dyspnea nightly, feels short of breath at work. He denied any significant weight gain. No hypoxia and ED, tachycardia and low 100s otherwise stable, BNP elevated at 570 with chest x-ray showing pulmonary vascular congestion, d-dimer negative.  Assessment & Plan    Principal Problem: Acute On chronic systolic CHF : Presented with orthopnea, PND, and dyspnea; BNP elevated to 570 and CXR features pulm vasc congestion.Previously had EF as low as 10% in July 2006; diagnosed with non-ischemic CM suspected secondary to alcohol abuse at that time. Echo (03/08/07) however showed normal EF 55-60%  - S/p Lasix 40 mg IV x1 given in ED with improvement in the symptoms.  - Creatinine trended up to 1.4 today, decrease Lasix to 40 mg IV daily from BID - Negative balance of 4.57 L, weight 215lbs on admission, currently 210 - 2-D echo showed severely reduced systolic function EF of 20-25% with akinesis of the anteroseptal myocardium, pulmonary hypertension with PA pressure 51. Prior echo in 2008 had shown EF of 55%. Cardiology consulted, may need  ischemia workup however will defer to cardiology, troponins negative. - Continue aspirin, lisinopril, Lasix, will add beta blocker if BP allows.  Hypertension - BP currently on lower side, monitor with diuresis.  -  2-D echo showed EF of 20-25%   Hyperlipidemia  - Fasting lipid panel showed LDL 113, triglycerides 154 - Consulted on low-fat diet, place on lovastatin ($4 [employer] list)  History of alcohol abuse - Counseled strongly on alcohol cessation, placed on alcohol withdrawal protocol, last drink on Sunday, 3 days ago  Diabetes mellitus - Hemoglobin A1c is 6.4, not on any medications - Nutrition education regarding CHF and diabetes mellitus  Code Status: Full CODE STATUS  Family Communication: Discussed in detail with the patient, all imaging results, lab results explained to the patient  Disposition Plan: await cardiology recommendations  Time Spent in minutes   25 minutes  Procedures  2-D echo  Consults   Cardiology  DVT Prophylaxis  Lovenox   Medications  Scheduled Meds: . aspirin EC  81 mg Oral Daily  . enoxaparin (LOVENOX) injection  40 mg Subcutaneous Q24H  . folic acid  1 mg Oral  Daily  . furosemide  40 mg Intravenous Q12H  . lisinopril  2.5 mg Oral Daily  . multivitamin with minerals  1 tablet Oral QHS  . potassium chloride  10 mEq Oral BID  . pravastatin  10 mg Oral q1800  . sodium chloride flush  3 mL Intravenous Q12H  . thiamine  100 mg Oral Daily   Or  . thiamine  100 mg Intravenous Daily   Continuous Infusions:  PRN Meds:.sodium chloride, acetaminophen, LORazepam **OR** LORazepam, ondansetron (ZOFRAN) IV, sodium chloride flush   Antibiotics   Anti-infectives    None        Subjective:   Vu Liebman was seen and examined today.  Currently feeling better, no complaints, No chest pain, shortness of breath is improving. No coughing, fevers or chills. Patient denies dizziness, chest pain, shortness of breath, abdominal pain, N/V/D/C, new  weakness, numbess, tingling.   Objective:   Filed Vitals:   01/04/16 1841 01/04/16 2100 01/04/16 2358 01/05/16 0544  BP: 104/67 102/57 100/52 107/90  Pulse: 100 94 100 100  Temp:  97.4 F (36.3 C) 97.6 F (36.4 C) 98 F (36.7 C)  TempSrc:  Oral Oral Oral  Resp:  Height:      Weight:    95.301 kg (210 lb 1.6 oz)  SpO2:  100% 100% 100%    Intake/Output Summary (Last 24 hours) at 01/05/16 1026 Last data filed at 01/05/16 0715  Gross per 24 hour  Intake    880 ml  Output   1775 ml  Net   -895 ml     Wt Readings from Last 3 Encounters:  01/05/16 95.301 kg (210 lb 1.6 oz)  07/10/15 106.595 kg (235 lb)  05/15/11 107.049 kg (236 lb)     Exam  General: Alert and oriented x 3, NAD  HEENT:  Neck: Supple, no JVD  CVS: S1 S2 auscultated, Regular rate and rhythm.  Respiratory: Decreased breath sounds at the bases otherwise fairly clear  Abdomen: Soft, nontender, nondistended, + bowel sounds  Ext: no cyanosis clubbing, trace edema  Neuro: no new deficits  Skin: No rashes  Psych: Normal affect and demeanor, alert and oriented x3    Data Reviewed:  I have personally reviewed following labs and imaging studies  Micro Results No results found for this or any previous visit (from the past 240 hour(s)).  Radiology Reports Dg Chest 2 View  01/03/2016  CLINICAL DATA:  Shortness of breath starting of a few weeks ago. EXAM: CHEST  2 VIEW COMPARISON:  July 10, 2015 FINDINGS: The heart size and mediastinal contours are stable. Heart size is enlarged. There is enlargement of central pulmonary vessel unchanged compared prior exam. There is no focal pneumonia, frank edema or pleural effusion. The visualized skeletal structures are stable. IMPRESSION: Stable mild central pulmonary vascular congestion probably chronic. No pulmonary edema or focal pneumonia. Electronically Signed   By: Sherian Rein M.D.   On: 01/03/2016 13:17    CBC  Recent Labs Lab 01/03/16 1237    WBC 7.0  HGB 14.0  HCT 42.4  PLT 232  MCV 84.3  MCH 27.8  MCHC 33.0  RDW 15.0    Chemistries   Recent Labs Lab 01/03/16 1237 01/04/16 0221 01/05/16 0329  NA 139 141 141  K 3.7 4.0 4.0  CL 106 107 104  CO2 GLUCOSE 99 149* 116*  BUN CREATININE 1.17 1.12 1.42*  CALCIUM 9.4 9.1  9.2   ------------------------------------------------------------------------------------------------------------------ estimated creatinine clearance is 68.5 mL/min (by C-G formula based on Cr of 1.42). ------------------------------------------------------------------------------------------------------------------  Recent Labs  01/04/16 0221  HGBA1C 6.4*   ------------------------------------------------------------------------------------------------------------------  Recent Labs  01/04/16 0221  CHOL 178  HDL 34*  LDLCALC 113*  TRIG 154*  CHOLHDL 5.2   ------------------------------------------------------------------------------------------------------------------ No results for input(s): TSH, T4TOTAL, T3FREE, THYROIDAB in the last 72 hours.  Invalid input(s): FREET3 ------------------------------------------------------------------------------------------------------------------ No results for input(s): VITAMINB12, FOLATE, FERRITIN, TIBC, IRON, RETICCTPCT in the last 72 hours.  Coagulation profile No results for input(s): INR, PROTIME in the last 168 hours.   Recent Labs  01/03/16 1756  DDIMER <0.27    Cardiac Enzymes  Recent Labs Lab 01/03/16 2134 01/04/16 0221 01/04/16 0816  TROPONINI 0.03 <0.03 0.03   ------------------------------------------------------------------------------------------------------------------ Invalid input(s): POCBNP  No results for input(s): GLUCAP in the last 72 hours.   Warnie Belair M.D. [facility] 01/05/2016, 10:26 AM  Pager: [phone] Between 7am to 7pm - call Pager - [phone]  After 7pm go to  [email or web address] - password [email or web address]  Call night coverage person covering after 7pm

## 2016-01-06 DIAGNOSIS — I509 Heart failure, unspecified: Secondary | ICD-10-CM | POA: Diagnosis not present

## 2016-01-06 DIAGNOSIS — E785 Hyperlipidemia, unspecified: Secondary | ICD-10-CM | POA: Diagnosis not present

## 2016-01-06 DIAGNOSIS — I5023 Acute on chronic systolic (congestive) heart failure: Secondary | ICD-10-CM | POA: Insufficient documentation

## 2016-01-06 DIAGNOSIS — I10 Essential (primary) hypertension: Secondary | ICD-10-CM | POA: Diagnosis not present

## 2016-01-06 LAB — BASIC METABOLIC PANEL
Anion gap: 9 (ref 5–15)
BUN: 21 mg/dL — AB (ref 6–20)
CHLORIDE: 101 mmol/L (ref 101–111)
CO2: 29 mmol/L (ref 22–32)
CREATININE: 1.41 mg/dL — AB (ref 0.61–1.24)
Calcium: 9.8 mg/dL (ref 8.9–10.3)
GFR, EST NON AFRICAN AMERICAN: 57 mL/min — AB (ref >60)
Glucose, Bld: 108 mg/dL — ABNORMAL HIGH (ref 65–99)
Potassium: 4.1 mmol/L (ref 3.5–5.1)
SODIUM: 139 mmol/L (ref 135–145)

## 2016-01-06 LAB — HEMOGLOBIN A1C
Hgb A1c MFr Bld: 6.4 % — ABNORMAL HIGH (ref 4.8–5.6)
MEAN PLASMA GLUCOSE: 137 mg/dL

## 2016-01-06 LAB — MAGNESIUM: Magnesium: 2.1 mg/dL (ref 1.7–2.4)

## 2016-01-06 MED ORDER — SPIRONOLACTONE 25 MG PO TABS
25.0000 mg | ORAL_TABLET | Freq: Every day | ORAL | Status: DC
  Administered 2016-01-06 – 2016-01-07 (×2): 25 mg via ORAL
  Filled 2016-01-06 (×2): qty 1

## 2016-01-06 MED ORDER — LOSARTAN POTASSIUM 25 MG PO TABS
25.0000 mg | ORAL_TABLET | Freq: Two times a day (BID) | ORAL | Status: DC
  Administered 2016-01-06 – 2016-01-07 (×3): 25 mg via ORAL
  Filled 2016-01-06 (×3): qty 1

## 2016-01-06 MED FILL — FUROSEMIDE 80 MG TABLET: 80 | 100 days supply | Qty: 100 | Fill #0

## 2016-01-06 MED FILL — SPIRONOLACTONE 25 MG TABLET: 25 | 34 days supply | Qty: 34 | Fill #0

## 2016-01-06 MED FILL — KLOR-CON M20 TABLET: 20 | 34 days supply | Qty: 34 | Fill #0

## 2016-01-06 MED FILL — DIGITEK 125 MCG TABLET: 125 | 34 days supply | Qty: 34 | Fill #0

## 2016-01-06 MED FILL — LOSARTAN POTASSIUM 25 MG TA: 25 | 34 days supply | Qty: 68 | Fill #0

## 2016-01-06 NOTE — Progress Notes (Addendum)
Patient's discharge HF medications Spiro 25 daily, Lasix 80 daily, Losartan 25 bid, Digoxin 0.125mg  daily, Potassium 20 meq daily were delivered to room filled from outpatient pharmacy and provided through the [facility].  Patient given a map and directions to get to the A[facility] Clinic.  I have reinforced the importance of taking all prescribed medications and following the HF recommendations for home that we discussed yesterday.  He can teach back the recommendations and agrees that he will "do better" in the future.

## 2016-01-06 NOTE — Progress Notes (Signed)
[facility]                                                                              Patient Demographics  Brendan Rogers, is a 50 y.o. male, DOB - September 13, 1965, WUJ:[id number]  Admit date - 01/03/2016   Admitting Physician Briscoe Deutscher, MD  Outpatient Primary MD for the patient is [facility], MD  Outpatient specialists: Dr. Antoine Poche with cardiology but has not seen over 2 years  LOS - 2  days    Chief Complaint  Patient presents with  . Shortness of Breath  . Chest Pain       Brief summary   Patient is a 50 year old male with hypertension, alcohol abuse, hyperlipidemia, nonischemic cardiomyopathy presented to ED with several days of progressive dyspnea for last 1 week. He has not followed up with cardiology for over 2 years as he lost insurance. He denied any leg swelling or chest pain however had shortness of breath, wakes with dyspnea nightly, feels short of breath at work. He denied any significant weight gain. No hypoxia and ED, tachycardia and low 100s otherwise stable, BNP elevated at 570 with chest x-ray showing pulmonary vascular congestion, d-dimer negative.  Assessment & Plan    Principal Problem: Acute On chronic systolic CHF : Presented with orthopnea, PND, and dyspnea; BNP elevated to 570 and CXR features pulm vasc congestion.Previously had EF as low as 10% in July 2006; diagnosed with non-ischemic CM suspected secondary to alcohol abuse at that time. Echo (03/08/07) however showed normal EF 55-60%  -  Continue IV Lasix to 40 mg BID, appreciate cardiology medications - Negative balance of 5.6 L, weight 215lbs on admission, currently 208 - 2-D echo showed severely reduced systolic function EF of 20-25% with akinesis of the anteroseptal myocardium, pulmonary hypertension with PA pressure 51. - Continue digoxin, losartan, Aldactone  Hypertension - BP stable -  2-D echo showed EF of 20-25%   Hyperlipidemia  - Fasting lipid panel showed LDL  113, triglycerides 154 - Consulted on low-fat diet, place on lovastatin ($4 [employer] list)  History of alcohol abuse - Counseled strongly on alcohol cessation, placed on alcohol withdrawal protocol, last drink on Sunday  Diabetes mellitus - Hemoglobin A1c is 6.4, not on any medications - Nutrition education regarding CHF and diabetes mellitus, counseled strongly on lifestyle changes, diet, will place on oral hypoglycemic at the time of discharge.  Code Status: Full CODE STATUS  Family Communication: Discussed in detail with the patient, all imaging results, lab results explained to the patient  Disposition Plan:   Time Spent in minutes   25 minutes  Procedures  2-D echo  Consults   Cardiology  DVT Prophylaxis  Lovenox   Medications  Scheduled Meds: . digoxin  0.125 mg Oral Daily  . enoxaparin (LOVENOX) injection  40 mg Subcutaneous Q24H  . folic acid  1 mg Oral Daily  . furosemide  40 mg Intravenous BID  . losartan  25 mg Oral BID  . multivitamin with minerals  1 tablet Oral QHS  . potassium chloride  10 mEq Oral Daily  . sodium chloride flush  3 mL Intravenous Q12H  .  spironolactone  25 mg Oral Daily  . thiamine  100 mg Oral Daily   Or  . thiamine  100 mg Intravenous Daily   Continuous Infusions:  PRN Meds:.sodium chloride, acetaminophen, LORazepam **OR** LORazepam, ondansetron (ZOFRAN) IV, sodium chloride flush   Antibiotics   Anti-infectives    None        Subjective:   Brendan Rogers was seen and examined today.  Feeling better, no chest pain. No significant shortness of breath however has not ambulated much. No coughing, fevers or chills. Patient denies dizziness, abdominal pain, N/V/D/C, new weakness, numbess, tingling.   Objective:   Filed Vitals:   01/05/16 2039 01/05/16 2349 01/06/16 0531 01/06/16 1019  BP: 117/70 101/80 102/71 101/76  Pulse: 104 103 102 78  Temp: 98.5 F (36.9 C) 98.2 F (36.8 C) 97.5 F (36.4 C)   TempSrc: Oral Oral Oral     Resp: 18 18 17    Height:      Weight:   94.484 kg (208 lb 4.8 oz)   SpO2: 100% 100% 97%     Intake/Output Summary (Last 24 hours) at 01/06/16 1034 Last data filed at 01/06/16 0820  Gross per 24 hour  Intake   1040 ml  Output   2401 ml  Net  -1361 ml     Wt Readings from Last 3 Encounters:  01/06/16 94.484 kg (208 lb 4.8 oz)  07/10/15 106.595 kg (235 lb)  05/15/11 107.049 kg (236 lb)     Exam  General: Alert and oriented x 3, NAD  HEENT:  Neck: Supple, + JVD  CVS: S1 S2 auscultated, Regular rate and rhythm, 2/6MR.  Respiratory: Decreased breath sounds at the bases otherwise fairly clear  Abdomen: Soft, NT, ND  Ext: no cyanosis clubbing, trace edema  Neuro: no new deficits  Skin: No rashes  Psych: Normal affect and demeanor, alert and oriented x3    Data Reviewed:  I have personally reviewed following labs and imaging studies  Micro Results No results found for this or any previous visit (from the past 240 hour(s)).  Radiology Reports Dg Chest 2 View  01/03/2016  CLINICAL DATA:  Shortness of breath starting of a few weeks ago. EXAM: CHEST  2 VIEW COMPARISON:  July 10, 2015 FINDINGS: The heart size and mediastinal contours are stable. Heart size is enlarged. There is enlargement of central pulmonary vessel unchanged compared prior exam. There is no focal pneumonia, frank edema or pleural effusion. The visualized skeletal structures are stable. IMPRESSION: Stable mild central pulmonary vascular congestion probably chronic. No pulmonary edema or focal pneumonia. Electronically Signed   By: Sherian Rein M.D.   On: 01/03/2016 13:17    CBC  Recent Labs Lab 01/03/16 1237  WBC 7.0  HGB 14.0  HCT 42.4  PLT 232  MCV 84.3  MCH 27.8  MCHC 33.0  RDW 15.0    Chemistries   Recent Labs Lab 01/03/16 1237 01/04/16 0221 01/05/16 0329 01/06/16 0302  NA 139 141 141 139  K 3.7 4.0 4.0 4.1  CL 106 107 104 101  CO2 24 26 29 29   GLUCOSE 99 149* 116* 108*   BUN 17 18 20  21*  CREATININE 1.17 1.12 1.42* 1.41*  CALCIUM 9.4 9.1 9.2 9.8  MG  --   --   --  2.1   ------------------------------------------------------------------------------------------------------------------ estimated creatinine clearance is 68.7 mL/min (by C-G formula based on Cr of 1.41). ------------------------------------------------------------------------------------------------------------------  Recent Labs  01/04/16 0221 01/05/16 0328  HGBA1C 6.4* 6.4*   ------------------------------------------------------------------------------------------------------------------  Recent Labs  01/04/16 0221  CHOL 178  HDL 34*  LDLCALC 113*  TRIG 154*  CHOLHDL 5.2   ------------------------------------------------------------------------------------------------------------------ No results for input(s): TSH, T4TOTAL, T3FREE, THYROIDAB in the last 72 hours.  Invalid input(s): FREET3 ------------------------------------------------------------------------------------------------------------------ No results for input(s): VITAMINB12, FOLATE, FERRITIN, TIBC, IRON, RETICCTPCT in the last 72 hours.  Coagulation profile No results for input(s): INR, PROTIME in the last 168 hours.   Recent Labs  01/03/16 1756  DDIMER <0.27    Cardiac Enzymes  Recent Labs Lab 01/03/16 2134 01/04/16 0221 01/04/16 0816  TROPONINI 0.03 <0.03 0.03   ------------------------------------------------------------------------------------------------------------------ Invalid input(s): POCBNP  No results for input(s): GLUCAP in the last 72 hours.   Massie Cogliano M.D. [facility] 01/06/2016, 10:34 AM  Pager: [phone] Between 7am to 7pm - call Pager - [phone]  After 7pm go to [email or web address] - password [email or web address]  Call night coverage person covering after 7pm

## 2016-01-06 NOTE — Evaluation (Signed)
Physical Therapy Evaluation Patient Details Name: Brendan Rogers MRN: [id number] DOB: 1966/07/20 Today's Date: 01/06/2016   History of Present Illness  Patient is a 50 yo male admitted 01/03/16 with DOE and CP.  Patient with CHF exacerbation.   PMH:  HTN, ETOH abuse, HLD, NICM, EF 20-25%    Clinical Impression  Patient is independent with mobility and gait.  Good balance during gait with no assistive device for 300'.  O2 sats at 100% during gait on room air.  HR increased from 93 bpm to 103 bpm. No acute PT needs identified - PT will sign off.  Encouraged patient to continue ambulation in hallway with nursing.    Follow Up Recommendations No PT follow up    Equipment Recommendations  None recommended by PT    Recommendations for Other Services       Precautions / Restrictions Precautions Precautions: None Restrictions Weight Bearing Restrictions: No      Mobility  Bed Mobility               General bed mobility comments: Patient in chair as PT entered room  Transfers Overall transfer level: Independent Equipment used: None                Ambulation/Gait Ambulation/Gait assistance: Independent Ambulation Distance (Feet): 300 Feet Assistive device: None Gait Pattern/deviations: WFL(Within Functional Limits)   Gait velocity interpretation: at or above normal speed for age/gender General Gait Details: Patient with good gait pattern, balance, and speed.  During gait, patient with O2 sat at 100%.  HR increased from 93bpm to 103 bpm.  Stairs            Wheelchair Mobility    Modified Rankin (Stroke Patients Only)       Balance Overall balance assessment: Independent                                           Pertinent Vitals/Pain Pain Assessment: No/denies pain   O2 sats 100% during gait on room air  HR increased 93 bpm to 103 bpm with gait.    Home Living Family/patient expects to be discharged to:: Private  residence Living Arrangements: Parent;Children (Mother and 18 yo son) Available Help at Discharge: Family;Available 24 hours/day Type of Home: House Home Access: Stairs to enter Entrance Stairs-Rails: Doctor, general practice of Steps: 4 Home Layout: One level Home Equipment: None      Prior Function Level of Independence: Independent         Comments: Works full time - on feet all day     Hand Dominance        Extremity/Trunk Assessment   Upper Extremity Assessment: Overall WFL for tasks assessed           Lower Extremity Assessment: Overall WFL for tasks assessed      Cervical / Trunk Assessment: Normal  Communication   Communication: No difficulties  Cognition Arousal/Alertness: Awake/alert Behavior During Therapy: WFL for tasks assessed/performed Overall Cognitive Status: Within Functional Limits for tasks assessed                      General Comments      Exercises        Assessment/Plan    PT Assessment Patent does not need any further PT services  PT Diagnosis Generalized weakness   PT Problem List    PT  Treatment Interventions     PT Goals (Current goals can be found in the Care Plan section) Acute Rehab PT Goals PT Goal Formulation: All assessment and education complete, DC therapy    Frequency     Barriers to discharge        Co-evaluation               End of Session   Activity Tolerance: Patient tolerated treatment well Patient left: in chair;with call bell/phone within reach Nurse Communication: Mobility status (Safe to ambulate in hallway.  No acute PT needs.)         Time: 6712-4580 PT Time Calculation (min) (ACUTE ONLY): 16 min   Charges:   PT Evaluation $PT Eval Moderate Complexity: 1 Procedure     PT G Codes:        Vena Austria 2016-01-19, 5:04 PM Durenda Hurt. Renaldo Fiddler, [facility] Acute Rehab Services Pager [phone]

## 2016-01-06 NOTE — Progress Notes (Signed)
Advanced Heart Failure Rounding Note  Referring Physician: Dr Rod Can Primary Physician: Kirstie Peri, MD Primary Cardiologist: Dr. Gala Romney   Reason for Consultation: A/C systolic CHF   Subjective:    Feeling OK.  Hasn't been up walking apart from around the room. Peeing some, yellow, only slightly darker.   Out 1.8 L and down 2 lbs. Creatinine stable on lasix 40 mg IV BID.   Objective:   Weight Range: 208 lb 4.8 oz (94.484 kg) Body mass index is 32.62 kg/(m^2).   Vital Signs:   Temp:  [97.5 F (36.4 C)-98.5 F (36.9 C)] 97.5 F (36.4 C) (04/28 0531) Pulse Rate:  [100-104] 102 (04/28 0531) Resp:  [17-18] 17 (04/28 0531) BP: (101-117)/(70-80) 102/71 mmHg (04/28 0531) SpO2:  [97 %-100 %] 97 % (04/28 0531) Weight:  [208 lb 4.8 oz (94.484 kg)] 208 lb 4.8 oz (94.484 kg) (04/28 0531) Last BM Date: 01/05/16  Weight change: Filed Weights   01/04/16 0454 01/05/16 0544 01/06/16 0531  Weight: 213 lb 8 oz (96.843 kg) 210 lb 1.6 oz (95.301 kg) 208 lb 4.8 oz (94.484 kg)    Intake/Output:   Intake/Output Summary (Last 24 hours) at 01/06/16 0840 Last data filed at 01/06/16 0820  Gross per 24 hour  Intake   1040 ml  Output   2401 ml  Net  -1361 ml     Physical Exam: General: Well appearing. No resp difficulty HEENT: normal Neck: supple. JVP 7-8 cm. Carotids 2+ bilat; no bruits. No thyromegaly or nodule noted Cor: PMI nondisplaced. Regular rhythm, slightly tachy.. +S3, 2/6 MR Lungs: CTAB Abdomen: soft, NT, mild/mod distended. No hepatosplenomegaly. No bruits or masses. Good bowel sounds. Extremities: no cyanosis, clubbing, rash, edema. Warm, not cool. Neuro: alert & orientedx3, cranial nerves grossly intact. moves all 4 extremities w/o difficulty. Affect pleasant  Telemetry: Reviewed, ST 100s  Labs: CBC  Recent Labs  01/03/16 1237  WBC 7.0  HGB 14.0  HCT 42.4  MCV 84.3  PLT 232   Basic Metabolic Panel  Recent Labs  01/05/16 0329 01/06/16 0302  NA 141  139  K 4.0 4.1  CL 104 101  CO2 29 29  GLUCOSE 116* 108*  BUN 20 21*  CREATININE 1.42* 1.41*  CALCIUM 9.2 9.8  MG  --  2.1   Liver Function Tests No results for input(s): AST, ALT, ALKPHOS, BILITOT, PROT, ALBUMIN in the last 72 hours. No results for input(s): LIPASE, AMYLASE in the last 72 hours. Cardiac Enzymes  Recent Labs  01/03/16 2134 01/04/16 0221 01/04/16 0816  TROPONINI 0.03 <0.03 0.03    BNP: BNP (last 3 results)  Recent Labs  07/10/15 1554 01/03/16 1747  BNP 193.0* 569.9*    ProBNP (last 3 results) No results for input(s): PROBNP in the last 8760 hours.   D-Dimer  Recent Labs  01/03/16 1756  DDIMER <0.27   Hemoglobin A1C  Recent Labs  01/05/16 0328  HGBA1C 6.4*   Fasting Lipid Panel  Recent Labs  01/04/16 0221  CHOL 178  HDL 34*  LDLCALC 113*  TRIG 154*  CHOLHDL 5.2   Thyroid Function Tests No results for input(s): TSH, T4TOTAL, T3FREE, THYROIDAB in the last 72 hours.  Invalid input(s): FREET3  Other results:     Imaging/Studies:   No results found.  Latest Echo  Latest Cath   Medications:     Scheduled Medications: . aspirin EC  81 mg Oral Daily  . digoxin  0.125 mg Oral Daily  . enoxaparin (  LOVENOX) injection  40 mg Subcutaneous Q24H  . folic acid  1 mg Oral Daily  . furosemide  40 mg Intravenous BID  . lisinopril  2.5 mg Oral BID  . multivitamin with minerals  1 tablet Oral QHS  . potassium chloride  10 mEq Oral Daily  . pravastatin  10 mg Oral q1800  . sodium chloride flush  3 mL Intravenous Q12H  . spironolactone  12.5 mg Oral Daily  . thiamine  100 mg Oral Daily   Or  . thiamine  100 mg Intravenous Daily     Infusions:     PRN Medications:  sodium chloride, acetaminophen, LORazepam **OR** LORazepam, ondansetron (ZOFRAN) IV, sodium chloride flush   Assessment   1. A/C systolic CHF due to NICM EF 20-25% 2. HTN 3. HLD 4. ETOH abuse - on CIWA 5. DM2 - per primary 6. Obesity  Plan    Stop lisinopril. Will start losartan 25 mg BID with plans to initially transition to [facility].   Increase Spiro to 25 mg daily.   Needs at least 1 more day of IV diuresis.  Likely home tomorrow vs Sunday. Will send home on 80 mg po lasix for now.   Will have cardiac rehab work with.   Length of Stay: 2   Graciella Freer PA-C 01/06/2016, 8:40 AM  Advanced Heart Failure Team Pager [phone] (M-F; 7a - 4p)  Please contact [facility] for night-coverage after hours (4p -7a ) and weekends on [email or web address]   Patient seen and examined with Otilio Saber, PA-C. We discussed all aspects of the encounter. I agree with the assessment and plan as stated above.   Has diuresed well. Still mildly volume overloaded. Continue diuresis one more day. Titrate losartan and spiro. No b-blocker yet. Will provide HF meds for d/c. Hopefully can go home tomorrow or Sunday. Stressed need for compliance with meds and ETOH cessation.   D/c meds (when ready):  Cleda Daub 25 Lasix 80 daily Losartan 25 bid Digoxin 0.125mg  daily  Stop ASA and prava.   Bensimhon, Daniel,MD 10:25 AM

## 2016-01-06 NOTE — Progress Notes (Signed)
Nutrition Education Note  RD consulted for nutrition education regarding new onset CHF.  RD discussed "Low Sodium Nutrition Therapy" handout from the Academy of [employer]. Reviewed patient's dietary recall. Provided examples on ways to decrease sodium intake in diet. Discouraged intake of processed foods and use of salt shaker. Encouraged fresh fruits and vegetables as well as whole grain sources of carbohydrates to maximize fiber intake.   RD discussed why it is important for patient to adhere to diet recommendations, and emphasized the role of fluids, foods to avoid, and importance of weighing self daily. Teach back method used.  Expect good compliance. Pt states that he plans to use garlic powder, onion powder and other seasonings to flavor his food instead of salt and he plans to decrease his intake of bacon, bologna, pizza, and hamburgers.   Body mass index is 32.62 kg/(m^2). Pt meets criteria for Obesity based on current BMI.  Current diet order is Heart Healthy/Carb Modified, patient is consuming approximately 100% of meals at this time. Labs and medications reviewed. No further nutrition interventions warranted at this time. RD contact information provided. If additional nutrition issues arise, please re-consult RD.   Dorothea Ogle RD, LDN Inpatient Clinical Dietitian Pager: [phone] After Hours Pager: [phone]

## 2016-01-07 DIAGNOSIS — I10 Essential (primary) hypertension: Secondary | ICD-10-CM | POA: Diagnosis not present

## 2016-01-07 DIAGNOSIS — E785 Hyperlipidemia, unspecified: Secondary | ICD-10-CM | POA: Diagnosis not present

## 2016-01-07 DIAGNOSIS — I509 Heart failure, unspecified: Secondary | ICD-10-CM | POA: Diagnosis not present

## 2016-01-07 DIAGNOSIS — I5023 Acute on chronic systolic (congestive) heart failure: Secondary | ICD-10-CM | POA: Diagnosis not present

## 2016-01-07 LAB — BASIC METABOLIC PANEL
Anion gap: 10 (ref 5–15)
BUN: 21 mg/dL — ABNORMAL HIGH (ref 6–20)
CHLORIDE: 100 mmol/L — AB (ref 101–111)
CO2: 28 mmol/L (ref 22–32)
CREATININE: 1.28 mg/dL — AB (ref 0.61–1.24)
Calcium: 9.5 mg/dL (ref 8.9–10.3)
GFR calc Af Amer: >60 mL/min (ref >60)
GFR calc non Af Amer: >60 mL/min (ref >60)
Glucose, Bld: 107 mg/dL — ABNORMAL HIGH (ref 65–99)
Potassium: 3.9 mmol/L (ref 3.5–5.1)
Sodium: 138 mmol/L (ref 135–145)

## 2016-01-07 MED ORDER — SODIUM CHLORIDE 0.9 % IV BOLUS (SEPSIS)
250.0000 mL | Freq: Once | INTRAVENOUS | Status: AC
  Administered 2016-01-07: 250 mL via INTRAVENOUS

## 2016-01-07 MED ORDER — FUROSEMIDE 80 MG PO TABS: 80.0000 mg | ORAL_TABLET | Freq: Every day | ORAL | Status: DC

## 2016-01-07 MED ORDER — LOSARTAN POTASSIUM 25 MG PO TABS: 25.0000 mg | ORAL_TABLET | Freq: Two times a day (BID) | ORAL | Status: DC

## 2016-01-07 MED ORDER — POTASSIUM CHLORIDE CRYS ER 20 MEQ PO TBCR: 20.0000 meq | EXTENDED_RELEASE_TABLET | Freq: Every day | ORAL | Status: DC

## 2016-01-07 MED ORDER — SPIRONOLACTONE 25 MG PO TABS: 25.0000 mg | ORAL_TABLET | Freq: Every day | ORAL | Status: DC

## 2016-01-07 MED ORDER — DIGOXIN 125 MCG PO TABS: 0.1250 mg | ORAL_TABLET | Freq: Every day | ORAL | Status: DC

## 2016-01-07 MED ORDER — FUROSEMIDE 40 MG PO TABS: 40.0000 mg | ORAL_TABLET | Freq: Two times a day (BID) | ORAL | Status: DC

## 2016-01-07 NOTE — Discharge Summary (Signed)
Physician Discharge Summary   Patient ID: Brendan Rogers MRN: [id number] DOB/AGE: 1966-04-22 50 y.o.  Admit date: 01/03/2016 Discharge date: 01/07/2016  Primary Care Physician:  Kirstie Peri, MD  Discharge Diagnoses:    . Acute on chronic systolic CHF (congestive heart failure) (HCC) . Hypertension . Hyperlipidemia   Noncompliance   Alcohol abuse   Nonsustained V. tach  Consults: Cardiology  Recommendations for Outpatient Follow-up:  1. Please repeat CBC/BMET at next visit   DIET: Low-salt diet    Allergies:  No Known Allergies   DISCHARGE MEDICATIONS: Discharge Medication List as of 01/07/2016 10:38 AM    START taking these medications   Details  digoxin (LANOXIN) 0.125 MG tablet Take 1 tablet (0.125 mg total) by mouth daily., Starting 01/07/2016, Until Discontinued, Print    losartan (COZAAR) 25 MG tablet Take 1 tablet (25 mg total) by mouth 2 (two) times daily., Starting 01/07/2016, Until Discontinued, Print    potassium chloride (K-DUR,KLOR-CON) 20 MEQ tablet Take 1 tablet (20 mEq total) by mouth daily., Starting 01/08/2016, Until Discontinued, Print    spironolactone (ALDACTONE) 25 MG tablet Take 1 tablet (25 mg total) by mouth daily., Starting 01/07/2016, Until Discontinued, Print      CONTINUE these medications which have CHANGED   Details  furosemide (LASIX) 80 MG tablet Take 1 tablet (80 mg total) by mouth daily., Starting 01/08/2016, Until Discontinued, Print      CONTINUE these medications which have NOT CHANGED   Details  multivitamin (THERAGRAN) per tablet Take 1 tablet by mouth at bedtime. , Until Discontinued, Historical Med    Omega-3 Fatty Acids (FISH OIL) 300 MG CAPS Take 1 capsule by mouth at bedtime. , Until Discontinued, Historical Med      STOP taking these medications     aspirin 81 MG tablet      bisoprolol-hydrochlorothiazide (ZIAC) 10-6.25 MG per tablet      lisinopril (PRINIVIL,ZESTRIL) 10 MG tablet          Brief H and P: For  complete details please refer to admission H and P, but in brief Patient is a 50 year old male with hypertension, alcohol abuse, hyperlipidemia, nonischemic cardiomyopathy presented to ED with several days of progressive dyspnea for last 1 week. He has not followed up with cardiology for over 2 years as he lost insurance. He denied any leg swelling or chest pain however had shortness of breath, wakes with dyspnea nightly, feels short of breath at work. He denied any significant weight gain. No hypoxia and ED, tachycardia and low 100s otherwise stable, BNP elevated at 570 with chest x-ray showing pulmonary vascular congestion, d-dimer negative.  Hospital Course:  Acute On chronic systolic CHF : Presented with orthopnea, PND, and dyspnea; BNP elevated to 570 and CXR features pulm vasc congestion.Previously had EF as low as 10% in July 2006; diagnosed with non-ischemic CM suspected secondary to alcohol abuse at that time. Echo (03/08/07) however showed normal EF 55-60%  -Patient was placed on IV Lasix diuresis and cardiology was consulted. - Negative balance of 6.2 L at the time of discharge. weight 215lbs on admission, currently 207 - 2-D echo showed severely reduced systolic function EF of 20-25% with akinesis of the anteroseptal myocardium, pulmonary hypertension with PA pressure 51. - Continue digoxin, losartan, Aldactone, Lasix per recommendations from CHF team/ Dr Gala Romney. Patient was provided with the medications at the time of discharge from the CHF team.  Hypertension - BP stable - 2-D echo showed EF of 20-25%  Hyperlipidemia  - Fasting  lipid panel showed LDL 113, triglycerides 154, patient was recommended low-fat diet, exercise  History of alcohol abuse - Counseled strongly on alcohol cessation, placed on alcohol withdrawal protocol, patient did not go into acute withdrawals or DTs.  Diabetes mellitus - Hemoglobin A1c is 6.4, not on any medications. CBGs remained stable -  Nutrition education provided regarding CHF and diabetes mellitus, counseled strongly on lifestyle changes, diet  Nonsustained V. Tach On the day of discharge patient had asymptomatic NSVT, 17 beats. Dr Donnie Aho discussed with EP, patient currently not on beta blocker per Dr. Gala Romney however patient has early follow-up in the heart failure clinic. No further conditions from the EP service.  Day of Discharge BP 99/76 mmHg  Pulse 106  Temp(Src) 98 F (36.7 C) (Oral)  Resp 18  Ht 5\' 7"  (1.702 m)  Wt 94.3 kg (207 lb 14.3 oz)  BMI 32.55 kg/m2  SpO2 98%  Physical Exam: General: Alert and awake oriented x3 not in any acute distress. HEENT: anicteric sclera, pupils reactive to light and accommodation CVS: S1-S2 clear no murmur rubs or gallops Chest: clear to auscultation bilaterally, no wheezing rales or rhonchi Abdomen: soft nontender, nondistended, normal bowel sounds Extremities: no cyanosis, clubbing or edema noted bilaterally Neuro: Cranial nerves II-XII intact, no focal neurological deficits   The results of significant diagnostics from this hospitalization (including imaging, microbiology, ancillary and laboratory) are listed below for reference.    LAB RESULTS: Basic Metabolic Panel:  Recent Labs Lab 01/06/16 0302 01/07/16 0524  NA 139 138  K 4.1 3.9  CL 101 100*  CO2 29 28  GLUCOSE 108* 107*  BUN 21* 21*  CREATININE 1.41* 1.28*  CALCIUM 9.8 9.5  MG 2.1  --    Liver Function Tests: No results for input(s): AST, ALT, ALKPHOS, BILITOT, PROT, ALBUMIN in the last 168 hours. No results for input(s): LIPASE, AMYLASE in the last 168 hours. No results for input(s): AMMONIA in the last 168 hours. CBC:  Recent Labs Lab 01/03/16 1237  WBC 7.0  HGB 14.0  HCT 42.4  MCV 84.3  PLT 232   Cardiac Enzymes:  Recent Labs Lab 01/04/16 0221 01/04/16 0816  TROPONINI <0.03 0.03   BNP: Invalid input(s): POCBNP CBG: No results for input(s): GLUCAP in the last 168  hours.  Significant Diagnostic Studies:  Dg Chest 2 View  01/03/2016  CLINICAL DATA:  Shortness of breath starting of a few weeks ago. EXAM: CHEST  2 VIEW COMPARISON:  July 10, 2015 FINDINGS: The heart size and mediastinal contours are stable. Heart size is enlarged. There is enlargement of central pulmonary vessel unchanged compared prior exam. There is no focal pneumonia, frank edema or pleural effusion. The visualized skeletal structures are stable. IMPRESSION: Stable mild central pulmonary vascular congestion probably chronic. No pulmonary edema or focal pneumonia. Electronically Signed   By: Sherian Rein M.D.   On: 01/03/2016 13:17    2D ECHO: Study Conclusions  - Left ventricle: The cavity size was moderately dilated. Systolic  function was severely reduced. The estimated ejection fraction  was in the range of 20% to 25%. There is akinesis of the  anteroseptal myocardium. - Mitral valve: There was moderate regurgitation. - Left atrium: The atrium was moderately dilated. - Right ventricle: The cavity size was mildly dilated. Wall  thickness was normal. - Right atrium: The atrium was moderately dilated. - Tricuspid valve: There was moderate regurgitation. - Pulmonary arteries: Systolic pressure was moderately increased.  PA peak pressure: 51 mm Hg (S).  Impressions:  - EF previously in 02/10/07 was 55%  Disposition and Follow-up: Discharge Instructions    (HEART FAILURE PATIENTS) Call MD:  Anytime you have any of the following symptoms: 1) 3 pound weight gain in 24 hours or 5 pounds in 1 week 2) shortness of breath, with or without a dry hacking cough 3) swelling in the hands, feet or stomach 4) if you have to sleep on extra pillows at night in order to breathe.    Complete by:  As directed      Diet - low sodium heart healthy    Complete by:  As directed      Increase activity slowly    Complete by:  As directed             DISPOSITION: home    DISCHARGE  FOLLOW-UP Follow-up Information    Follow up with Arvilla Meres, MD. Go on 01/11/2016.   Specialty:  Cardiology   Why:  AT 2:30pm , for hospital follow-up   Contact information:   [address] [place] [address] [phone]       Follow up with [facility], MD. Schedule an appointment as soon as possible for a visit in 2 weeks.   Specialty:  Internal Medicine   Why:  for hospital follow-up, obtain labs bmet   Contact information:   [address] [place] [address] [phone]        Time spent on Discharge:   Signed:   Peyten Weare M.D. [facility] 01/07/2016, 2:00 PM Pager: [phone]

## 2016-01-07 NOTE — Progress Notes (Signed)
0101 Patient had 17 beat run of VT returning to sinus rhythm while sleeping.Patient awaken denies shortness of breath or chest pain blood pressure 81/45.0114 Text paged T.Claiborne Billings [phone] New order received for 250 cc normal saline bolus .Will continue to monitor.

## 2016-01-07 NOTE — Progress Notes (Signed)
Subjective:  No complaints of shortness of breath today  Objective:  Vital Signs in the last 24 hours: BP 99/76 mmHg  Pulse 106  Temp(Src) 98 F (36.7 C) (Oral)  Resp 18  Ht 5\' 7"  (1.702 m)  Wt 94.3 kg (207 lb 14.3 oz)  BMI 32.55 kg/m2  SpO2 98%  Physical Exam: Mildly obese black male in no acute distress Lungs:  Clear  Cardiac:  Regular rhythm, normal S1 and S2, soft S3, neck veins flat  Extremities:  No edema present  Intake/Output from previous day: 04/28 0701 - 04/29 0700 In: 1510 [P.O.:1260; IV Piggyback:250] Out: 1850 [Urine:1850] Weight Filed Weights   01/05/16 0544 01/06/16 0531 01/07/16 0425  Weight: 95.301 kg (210 lb 1.6 oz) 94.484 kg (208 lb 4.8 oz) 94.3 kg (207 lb 14.3 oz)    Lab Results: Basic Metabolic Panel:  Recent Labs  29/51/88 0302 01/07/16 0524  NA 139 138  K 4.1 3.9  CL 101 100*  CO2 29 28  GLUCOSE 108* 107*  BUN 21* 21*  CREATININE 1.41* 1.28*     BNP    Component Value Date/Time   BNP 569.9* 01/03/2016 1747   Telemetry: Sinus rhythm, he had 17 beats of nonsustained ventricular tachycardia last night.  Assessment/Plan:   1.  Cardiomyopathy-presumed nonischemic in the setting of alcohol use 2.  Acute on chronic systolic heart failure clinically improved and wants to go home 3.  Significant alcohol abuse 4.  Nonsustained ventricular tachycardia  Recommendations:  The patient is anxious to go home at this time and his weight is now down 8 pounds.  Discussed the VT with EP and plan discharge home.  Patient currently not on beta blocker per Dr. Gala Romney but will arrange early follow-up in the heart failure clinic.  Patient does have medicines.  Importance of compliance discussed with patient     W. Ashley Royalty  MD [facility]  01/07/2016, 10:41 AM

## 2016-01-07 NOTE — Discharge Summary (Signed)
Pt got discharged to home, discharge instructions provided and patient showed understanding to it, IV taken out,Telemonitor DC,pt left unit by ambulation (pt refuse to use wheelchair and said that he would be fine and family member agree)with all of the belongings accompanied with a family member (wife)

## 2016-01-11 ENCOUNTER — Ambulatory Visit (HOSPITAL_COMMUNITY)
Admit: 2016-01-11 | Discharge: 2016-01-11 | Disposition: A | Discharge status: Home / Self Care | Payer: Managed Care, Other (non HMO) | Source: Ambulatory Visit | Attending: Cardiology | Admitting: Cardiology

## 2016-01-11 ENCOUNTER — Encounter: Payer: Self-pay | Admitting: Licensed Clinical Social Worker

## 2016-01-11 VITALS — BP 100/72 | HR 107 | Wt 208.4 lb

## 2016-01-11 DIAGNOSIS — I5022 Chronic systolic (congestive) heart failure: Secondary | ICD-10-CM | POA: Diagnosis not present

## 2016-01-11 DIAGNOSIS — E669 Obesity, unspecified: Secondary | ICD-10-CM

## 2016-01-11 DIAGNOSIS — I10 Essential (primary) hypertension: Secondary | ICD-10-CM

## 2016-01-11 DIAGNOSIS — Z79899 Other long term (current) drug therapy: Secondary | ICD-10-CM | POA: Insufficient documentation

## 2016-01-11 DIAGNOSIS — E119 Type 2 diabetes mellitus without complications: Secondary | ICD-10-CM | POA: Insufficient documentation

## 2016-01-11 DIAGNOSIS — I426 Alcoholic cardiomyopathy: Secondary | ICD-10-CM

## 2016-01-11 DIAGNOSIS — E785 Hyperlipidemia, unspecified: Secondary | ICD-10-CM

## 2016-01-11 DIAGNOSIS — I428 Other cardiomyopathies: Secondary | ICD-10-CM | POA: Insufficient documentation

## 2016-01-11 DIAGNOSIS — I110 Hypertensive heart disease with heart failure: Secondary | ICD-10-CM | POA: Insufficient documentation

## 2016-01-11 DIAGNOSIS — F1010 Alcohol abuse, uncomplicated: Secondary | ICD-10-CM | POA: Insufficient documentation

## 2016-01-11 DIAGNOSIS — N179 Acute kidney failure, unspecified: Secondary | ICD-10-CM | POA: Insufficient documentation

## 2016-01-11 DIAGNOSIS — I5023 Acute on chronic systolic (congestive) heart failure: Secondary | ICD-10-CM | POA: Diagnosis not present

## 2016-01-11 DIAGNOSIS — Z8249 Family history of ischemic heart disease and other diseases of the circulatory system: Secondary | ICD-10-CM | POA: Insufficient documentation

## 2016-01-11 LAB — BASIC METABOLIC PANEL
Anion gap: 10 (ref 5–15)
BUN: 34 mg/dL — AB (ref 6–20)
CALCIUM: 9.7 mg/dL (ref 8.9–10.3)
CO2: 22 mmol/L (ref 22–32)
CREATININE: 1.28 mg/dL — AB (ref 0.61–1.24)
Chloride: 104 mmol/L (ref 101–111)
GFR calc Af Amer: >60 mL/min (ref >60)
GLUCOSE: 104 mg/dL — AB (ref 65–99)
Potassium: 4.3 mmol/L (ref 3.5–5.1)
SODIUM: 136 mmol/L (ref 135–145)

## 2016-01-11 NOTE — Progress Notes (Signed)
Advanced Heart Failure Medication Review by a Pharmacist  Does the patient  feel that his/her medications are working for him/her?  yes  Has the patient been experiencing any side effects to the medications prescribed?  no  Does the patient measure his/her own blood pressure or blood glucose at home?  no   Does the patient have any problems obtaining medications due to transportation or finances?   no  Understanding of regimen: fair Understanding of indications: fair Potential of compliance: good Patient understands to avoid NSAIDs. Patient understands to avoid decongestants.  Issues to address at subsequent visits: None   Pharmacist comments:  Brendan Rogers is a pleasant 50 yo M presenting without a medication list and with some confusion about his regimen which his girlfriend manages but who is not here today. He reports good compliance with his regimen. He did ask if he could start taking ASA 81 mg for primary prevention. With his most recent A1c of 6.4%, he might benefit from addition but with his history of alcohol abuse I would be concerned with his risk for GI bleeding. Have relayed this information to him and recommended a discussion with our providers. He did not have any other medication-related questions or concerns for me at this time.   [name]. Bonnye Fava, PharmD, BCPS, CPP Clinical Pharmacist Pager: [phone] Phone: [phone] 01/11/2016 3:18 PM    Time with patient: 10 minutes Preparation and documentation time: 2 minutes Total time: 12 minutes

## 2016-01-11 NOTE — Progress Notes (Signed)
CSW referred to assist patient with insurance. Patient reports he has been working on and off and recently started a new job. He stated he has not had insurance since last summer. He reports with this new job he has an opportunity to [employer] in 3 months as long as he remains with the company. Patient resides in [facility] and was offered info on [employer] for primary care. Patient states he has attempted to gain insurance coverage with [employer] although the plans are too expensive and he can't afford. Patient recently hospitalized and unsure if he met with a financial counselor while in house. CSW discussed [place] discount program and provided patient with application and contact information. Patient verbalizes understanding and will contact CSW for further assistance with application if needed. Raquel Sarna, LCSW [phone]

## 2016-01-11 NOTE — Progress Notes (Signed)
Patient ID: Brendan Rogers, male   DOB: 26-Nov-1965, 50 y.o.   MRN: [id number]    Advanced Heart Failure Clinic Note   Primary Care: Kirstie Peri, MD Primary Cardiologist: Previously Dr Antoine Poche but hasn't followed up in > 2 years Primary HF: Dr. Gala Romney   HPI:  Brendan Rogers is a 50 y.o. male with hx of NICM, HTN, HLD, and alcohol abuse.  Pt reported EF 10% in June 2006, cath with insignificant CAD. Increased to 55-60% in 2008 by echo with medical management.After he improved, he stopped all medicine except for baby ASA and fish oil.  Echo (01/04/16) LVEF 20-25% with akinesis of the anteroseptal myocardium. Mod MR. Mod LAE. Mildly dilated RV. Mod dilated RA, Mod TR, PA peak pressure 51 mm Hg.   Admitted 01/03/16 with volume overload. EF back down in setting of medical noncompliance and ETOH abuse. Meds titrated and diuresed with IV lasix 40 mg BID.  Out 6 L and down 8 lbs. Discharge weight 207 lbs on hospital scales.   He presents today for post hospital follow up. Weight at home 201.5 this morning, states he was 207 when he got out of the hospital. Says he is peeing less and it is slightly darker, but still a yellow.  Hasn't gone back to work yet. Denies any DOE on flat ground. Swam 18 laps this morning and didn't have any SOB.  Denies lightheadedness or dizziness. States he hasn't had any alcohol since he left the hospital. Wants to take a daily aspirin.   Past Medical History  Diagnosis Date  . CHF (congestive heart failure) (HCC)     secondary to nonischemic cardiomyopathy, EF normal by echcardiogram  . History of ETOH abuse   . Hypertension   . Dyslipidemia     Current Outpatient Prescriptions  Medication Sig Dispense Refill  . digoxin (LANOXIN) 0.125 MG tablet Take 1 tablet (0.125 mg total) by mouth daily. 30 tablet 3  . furosemide (LASIX) 80 MG tablet Take 1 tablet (80 mg total) by mouth daily. 30 tablet 3  . losartan (COZAAR) 25 MG tablet Take 1 tablet (25 mg total) by  mouth 2 (two) times daily. 60 tablet 3  . multivitamin (THERAGRAN) per tablet Take 1 tablet by mouth at bedtime.     . Omega-3 Fatty Acids (FISH OIL) 300 MG CAPS Take 1 capsule by mouth at bedtime.     . potassium chloride (K-DUR,KLOR-CON) 20 MEQ tablet Take 1 tablet (20 mEq total) by mouth daily. 30 tablet 3  . spironolactone (ALDACTONE) 25 MG tablet Take 1 tablet (25 mg total) by mouth daily. 30 tablet 3   No current facility-administered medications for this encounter.    No Known Allergies    Social History   Social History  . Marital Status: Divorced    Spouse Name: N/A  . Number of Children: N/A  . Years of Education: N/A   Occupational History  . Not on file.   Social History Main Topics  . Smoking status: Never Smoker   . Smokeless tobacco: Not on file  . Alcohol Use: Yes     Comment: regularly, including 3 shots a day  . Drug Use: Not on file  . Sexual Activity: Not on file   Other Topics Concern  . Not on file   Social History Narrative      Family History  Problem Relation Age of Onset  . Coronary artery disease Other     Filed Vitals:   01/11/16  1501  BP: 100/72  Pulse: 107  Weight: 208 lb 6.4 oz (94.53 kg)  SpO2: 98%    PHYSICAL EXAM: General:  Well appearing. No respiratory difficulty HEENT: normal Neck: supple. no JVD. Carotids 2+ bilat; no bruits. No thyromegaly or nodule noted. Cor: PMI nondisplaced. Regular rhythm, slightly tachy. No rubs . +S3 2/6 MR Lungs: CTAB, normal effort Abdomen: soft, nontender, mildly distended. No hepatosplenomegaly. No bruits or masses. Good bowel sounds. Extremities: no cyanosis, clubbing, rash, edema Neuro: alert & oriented x 3, cranial nerves grossly intact. moves all 4 extremities w/o difficulty. Affect pleasant.  ASSESSMENT & PLAN:  1. Chronic systolic CHF due to NICM EF 20-25% 2. HTN 3. HLD 4. ETOH abuse - Congratulated on his current abstinence. 5. DM2 - per primary 6. Obesity 7. AKI   His  volume status looks stable. He is up a lb from hospital scales, but down 5 lbs by his home weight.    Is is likely OK for him to take daily ASA, though would need to be abstinent from ETOH with increased risk of bleeding/ulceration. Told him it would be as primary prevention and he does not really need to take.   With concerns for low output, would like pt to have CPX test as early as possible. If his cardiac limitation looks ok, will be OK for him to go back to work.  He remains tachy and with a third heart sound. He was made aware our recommendation for him returning to work will be based on his CPX test.   Will not increase meds today with upcoming CPX and soft BP.    Creatinine had started to improve on discharge. Recheck BMET today and follow up in 2 weeks.   Casimiro Needle "Mardelle Matte" [place], PA-C 01/11/2016 3:48 PM   Total time spent 25 minutes, over half discussing the above.

## 2016-01-11 NOTE — Patient Instructions (Signed)
Labs today  Your physician has recommended that you have a cardiopulmonary stress test (CPX). CPX testing is a non-invasive measurement of heart and lung function. It replaces a traditional treadmill stress test. This type of test provides a tremendous amount of information that relates not only to your present condition but also for future outcomes. This test combines measurements of you ventilation, respiratory gas exchange in the lungs, electrocardiogram (EKG), blood pressure and physical response before, during, and following an exercise protocol.  Your physician recommends that you schedule a follow-up appointment in: 2 weeks  Do the following things EVERYDAY: 1) Weigh yourself in the morning before breakfast. Write it down and keep it in a log. 2) Take your medicines as prescribed 3) Eat low salt foods-Limit salt (sodium) to 2000 mg per day.  4) Stay as active as you can everyday 5) Limit all fluids for the day to less than 2 liters 6)

## 2016-01-13 ENCOUNTER — Other Ambulatory Visit (HOSPITAL_COMMUNITY): Payer: Self-pay | Admitting: *Deleted

## 2016-01-13 ENCOUNTER — Ambulatory Visit (HOSPITAL_COMMUNITY): Payer: Managed Care, Other (non HMO) | Attending: Student

## 2016-01-13 DIAGNOSIS — I5023 Acute on chronic systolic (congestive) heart failure: Secondary | ICD-10-CM

## 2016-01-25 NOTE — Progress Notes (Signed)
Patient ID: Brendan Rogers, male   DOB: 1965-12-31, 50 y.o.   MRN: [id number]    Advanced Heart Failure Clinic Note   Primary Care: Kirstie Peri, MD Primary Cardiologist: Previously Dr Antoine Poche but hasn't followed up in > 2 years Primary HF: Dr. Gala Romney   HPI:  Brendan Rogers is a 50 y.o. male with hx of NICM, HTN, HLD, and alcohol abuse.  Pt reported EF 10% in June 2006, cath with insignificant CAD. Increased to 55-60% in 2008 by echo with medical management.After he improved, he stopped all medicine except for baby ASA and fish oil.  Echo (01/04/16) LVEF 20-25% with akinesis of the anteroseptal myocardium. Mod MR. Mod LAE. Mildly dilated RV. Mod dilated RA, Mod TR, PA peak pressure 51 mm Hg.   Admitted 01/03/16 with volume overload. EF back down in setting of medical noncompliance and ETOH abuse. Meds titrated and diuresed with IV lasix 40 mg BID.  Out 6 L and down 8 lbs. Discharge weight 207 lbs on hospital scales.   He presents today for regular follow up. CPX earlier this month with no cardiovascular limitations. Mild restrictive lung physiology. Exercise appears to be mostly limited 2/2 body habitus and restrictive lungs. (full report below) He's back at work and working 8 hrs 5 days a week.  May be getting laid off in 2 months, so unsure what he will do.  Denies dizziness and lightheadedness. Denies any DOE.  Jogs in the morning about a mile and doesn't feel SOB. Still swimming. Denies any alcohol.    CPX 01/13/16 FVC 2.85 (75%)    FEV1 2.38 (78%)     FEV1/FVC 83 (104%)     MVV 121 (85%)  Exercise Time:16:43 Speed (mph): 4.0  Grade (%): 16.5   RPE: 17 Reason stopped: Patient ended test due to overall fatigue. Additional symptoms: Dyspnea (7/10) Resting HR: 101 Peak HR: 170  (100% age predicted max HR) BP rest: 102/78 BP peak: 160/76 Peak VO2: 30.7 (104% predicted peak VO2) VE/VCO2 slope: 31 OUES: 3.10 Peak RER: 1.06 Ventilatory Threshold: 18.9  (64% predicted  or measured peak VO2) Peak RR 45 Peak Ventilation: 84.8 VE/MVV: 95% PETCO2 at peak: 38 O2pulse: 17  (106% predicted O2pulse)  Past Medical History  Diagnosis Date  . CHF (congestive heart failure) (HCC)     secondary to nonischemic cardiomyopathy, EF normal by echcardiogram  . History of ETOH abuse   . Hypertension   . Dyslipidemia     Current Outpatient Prescriptions  Medication Sig Dispense Refill  . digoxin (LANOXIN) 0.125 MG tablet Take 1 tablet (0.125 mg total) by mouth daily. 30 tablet 3  . furosemide (LASIX) 80 MG tablet Take 1 tablet (80 mg total) by mouth daily. 30 tablet 3  . losartan (COZAAR) 25 MG tablet Take 1 tablet (25 mg total) by mouth 2 (two) times daily. 60 tablet 3  . multivitamin (THERAGRAN) per tablet Take 1 tablet by mouth at bedtime.     . Omega-3 Fatty Acids (FISH OIL) 300 MG CAPS Take 1 capsule by mouth at bedtime.     . potassium chloride (K-DUR,KLOR-CON) 20 MEQ tablet Take 1 tablet (20 mEq total) by mouth daily. 30 tablet 3  . spironolactone (ALDACTONE) 25 MG tablet Take 1 tablet (25 mg total) by mouth daily. 30 tablet 3   No current facility-administered medications for this encounter.    No Known Allergies    Social History   Social History  . Marital Status: Divorced  Spouse Name: N/A  . Number of Children: N/A  . Years of Education: N/A   Occupational History  . Not on file.   Social History Main Topics  . Smoking status: Never Smoker   . Smokeless tobacco: Not on file  . Alcohol Use: Yes     Comment: regularly, including 3 shots a day  . Drug Use: Not on file  . Sexual Activity: Not on file   Other Topics Concern  . Not on file   Social History Narrative      Family History  Problem Relation Age of Onset  . Coronary artery disease Other     Filed Vitals:   01/26/16 1051  BP: 106/68  Pulse: 89  Weight: 202 lb 12.8 oz (91.989 kg)  SpO2: 98%   Wt Readings from Last 3 Encounters:  01/26/16 202 lb 12.8 oz  (91.989 kg)  01/11/16 208 lb 6.4 oz (94.53 kg)  01/07/16 207 lb 14.3 oz (94.3 kg)     PHYSICAL EXAM: General:  Well appearing. No respiratory difficulty HEENT: normal Neck: supple. no JVD. Carotids 2+ bilat; no bruits. No thyromegaly or nodule noted. Cor: PMI nondisplaced. Regular rhythm, slightly tachy. No rubs . +S3 2/6 MR Lungs: CTAB, normal effort Abdomen: soft, nontender, nondistended. No hepatosplenomegaly. No bruits or masses. +BS Extremities: no cyanosis, clubbing, rash, edema Neuro: alert & oriented x 3, cranial nerves grossly intact. moves all 4 extremities w/o difficulty. Affect pleasant.  ASSESSMENT & PLAN:  1. Chronic systolic CHF due to NICM Echo 1/42/39 EF 20-25% 2. HTN 3. HLD 4. ETOH abuse - Congratulated on his continued abstinence. 5. DM2 - per primary 6. Obesity   Doing great. Back at work, very active.    With results of CPX, think it is safe to add low dose coreg at 3.125 mg BID.  Will plan for repeat Echo > October or later with continued med titration.   Will not transition to Entresto at this time with continued borderline pressure.   Check BMET today.  Follow up 4 weeks PA/NP side for continued med titration.   Casimiro Needle [address]" [place], PA-C 01/26/2016 11:12 AM

## 2016-01-26 ENCOUNTER — Ambulatory Visit (HOSPITAL_COMMUNITY)
Admission: RE | Admit: 2016-01-26 | Discharge: 2016-01-26 | Disposition: A | Discharge status: Home / Self Care | Payer: Managed Care, Other (non HMO) | Source: Ambulatory Visit | Attending: Internal Medicine | Admitting: Internal Medicine

## 2016-01-26 ENCOUNTER — Encounter: Payer: Self-pay | Admitting: Licensed Clinical Social Worker

## 2016-01-26 VITALS — BP 106/68 | HR 89 | Wt 202.8 lb

## 2016-01-26 DIAGNOSIS — F1011 Alcohol abuse, in remission: Secondary | ICD-10-CM | POA: Insufficient documentation

## 2016-01-26 DIAGNOSIS — I10 Essential (primary) hypertension: Secondary | ICD-10-CM

## 2016-01-26 DIAGNOSIS — F1010 Alcohol abuse, uncomplicated: Secondary | ICD-10-CM

## 2016-01-26 DIAGNOSIS — Z79899 Other long term (current) drug therapy: Secondary | ICD-10-CM | POA: Insufficient documentation

## 2016-01-26 DIAGNOSIS — I110 Hypertensive heart disease with heart failure: Secondary | ICD-10-CM | POA: Insufficient documentation

## 2016-01-26 DIAGNOSIS — I5023 Acute on chronic systolic (congestive) heart failure: Secondary | ICD-10-CM

## 2016-01-26 DIAGNOSIS — I5022 Chronic systolic (congestive) heart failure: Secondary | ICD-10-CM

## 2016-01-26 DIAGNOSIS — E669 Obesity, unspecified: Secondary | ICD-10-CM | POA: Insufficient documentation

## 2016-01-26 DIAGNOSIS — I428 Other cardiomyopathies: Secondary | ICD-10-CM | POA: Insufficient documentation

## 2016-01-26 DIAGNOSIS — E119 Type 2 diabetes mellitus without complications: Secondary | ICD-10-CM | POA: Insufficient documentation

## 2016-01-26 DIAGNOSIS — E785 Hyperlipidemia, unspecified: Secondary | ICD-10-CM | POA: Insufficient documentation

## 2016-01-26 LAB — BASIC METABOLIC PANEL
ANION GAP: 6 (ref 5–15)
BUN: 21 mg/dL — ABNORMAL HIGH (ref 6–20)
CALCIUM: 9.4 mg/dL (ref 8.9–10.3)
CO2: 26 mmol/L (ref 22–32)
Chloride: 106 mmol/L (ref 101–111)
Creatinine, Ser: 0.93 mg/dL (ref 0.61–1.24)
Glucose, Bld: 97 mg/dL (ref 65–99)
Potassium: 4 mmol/L (ref 3.5–5.1)
SODIUM: 138 mmol/L (ref 135–145)

## 2016-01-26 MED ORDER — CARVEDILOL 3.125 MG PO TABS: 3.1250 mg | ORAL_TABLET | Freq: Two times a day (BID) | ORAL | Status: DC

## 2016-01-26 NOTE — Progress Notes (Signed)
CSW met with patient at his request. Patient completed [employer] program application and asked CSW for assistance with review and submission. CSW reviewed, made copies and submitted application for review. Patient grateful for assistance and denies any other concerns at this time. Raquel Sarna, LCSW [phone]

## 2016-01-26 NOTE — Patient Instructions (Signed)
START Carvedilol 3.125 mg ,one tab twice a day  Labs today  Your physician recommends that you schedule a follow-up appointment in: 4 weeks In the [facility]  Do the following things EVERYDAY: 1) Weigh yourself in the morning before breakfast. Write it down and keep it in a log. 2) Take your medicines as prescribed 3) Eat low salt foods-Limit salt (sodium) to 2000 mg per day.  4) Stay as active as you can everyday 5) Limit all fluids for the day to less than 2 liters 6)

## 2016-02-09 MED FILL — SPIRONOLACTONE 25 MG TABLET: 25 | 34 days supply | Qty: 34 | Fill #1

## 2016-02-09 MED FILL — DIGITEK 125 MCG TABLET: 125 | 34 days supply | Qty: 34 | Fill #1

## 2016-02-09 MED FILL — LOSARTAN POTASSIUM 25 MG TA: 25 | 34 days supply | Qty: 68 | Fill #1

## 2016-02-23 ENCOUNTER — Ambulatory Visit (HOSPITAL_COMMUNITY)
Admission: RE | Admit: 2016-02-23 | Discharge: 2016-02-23 | Disposition: A | Discharge status: Home / Self Care | Payer: Self-pay | Source: Ambulatory Visit | Attending: Internal Medicine | Admitting: Internal Medicine

## 2016-02-23 VITALS — BP 110/72 | HR 76 | Wt 199.4 lb

## 2016-02-23 DIAGNOSIS — F1011 Alcohol abuse, in remission: Secondary | ICD-10-CM

## 2016-02-23 DIAGNOSIS — E785 Hyperlipidemia, unspecified: Secondary | ICD-10-CM | POA: Insufficient documentation

## 2016-02-23 DIAGNOSIS — F1010 Alcohol abuse, uncomplicated: Secondary | ICD-10-CM | POA: Insufficient documentation

## 2016-02-23 DIAGNOSIS — I5022 Chronic systolic (congestive) heart failure: Secondary | ICD-10-CM | POA: Insufficient documentation

## 2016-02-23 DIAGNOSIS — E119 Type 2 diabetes mellitus without complications: Secondary | ICD-10-CM | POA: Insufficient documentation

## 2016-02-23 DIAGNOSIS — I110 Hypertensive heart disease with heart failure: Secondary | ICD-10-CM | POA: Insufficient documentation

## 2016-02-23 DIAGNOSIS — Z7982 Long term (current) use of aspirin: Secondary | ICD-10-CM | POA: Insufficient documentation

## 2016-02-23 DIAGNOSIS — I428 Other cardiomyopathies: Secondary | ICD-10-CM | POA: Insufficient documentation

## 2016-02-23 DIAGNOSIS — I10 Essential (primary) hypertension: Secondary | ICD-10-CM

## 2016-02-23 DIAGNOSIS — E669 Obesity, unspecified: Secondary | ICD-10-CM | POA: Insufficient documentation

## 2016-02-23 DIAGNOSIS — Z79899 Other long term (current) drug therapy: Secondary | ICD-10-CM | POA: Insufficient documentation

## 2016-02-23 LAB — BASIC METABOLIC PANEL
ANION GAP: 5 (ref 5–15)
BUN: 19 mg/dL (ref 6–20)
CALCIUM: 9.4 mg/dL (ref 8.9–10.3)
CO2: 26 mmol/L (ref 22–32)
Chloride: 106 mmol/L (ref 101–111)
Creatinine, Ser: 0.87 mg/dL (ref 0.61–1.24)
GFR calc Af Amer: >60 mL/min (ref >60)
GLUCOSE: 105 mg/dL — AB (ref 65–99)
POTASSIUM: 3.9 mmol/L (ref 3.5–5.1)
SODIUM: 137 mmol/L (ref 135–145)

## 2016-02-23 MED ORDER — SACUBITRIL-VALSARTAN 24-26 MG PO TABS: 1.0000 | ORAL_TABLET | Freq: Two times a day (BID) | ORAL | Status: DC

## 2016-02-23 NOTE — Addendum Note (Signed)
Encounter addended by: Theresia Bough, CMA on: 02/23/2016  1:08 PM<BR>     Documentation filed: [employer], Orders

## 2016-02-23 NOTE — Progress Notes (Signed)
Advanced Heart Failure Medication Review by a Pharmacist  Does the patient  feel that his/her medications are working for him/her?  yes  Has the patient been experiencing any side effects to the medications prescribed?  no  Does the patient measure his/her own blood pressure or blood glucose at home?  no   Does the patient have any problems obtaining medications due to transportation or finances?   no  Understanding of regimen: fair Understanding of indications: fair Potential of compliance: fair Patient understands to avoid NSAIDs. Patient understands to avoid decongestants.   Pharmacist comments: Brendan Rogers is a pleasant 66 yom presenting to clinic for a follow-up appointment. He states that he is not experiencing any medication-related side effects and only misses 1 or 2 doses of his medications each week (works 3rd shift). Dr. Teressa Lower is wanting to switch Brendan Rogers to [name] today. Patient does not have any insurance. We will provide him with the 30-day free card and have him sign paperwork for the patient assistance program through [employer]. Patient denies any swelling, dizziness, or SOB.   Patient did not have any other medication-related questions or concerns at this time. Digoxin was started when the patient was in the hospital in April (no dig level yet) Will check level today with BMET.  Brendan Rogers, PharmD Pharmacy Resident  Pager: [phone] 02/23/2016 11:20 AM   Time with patient: 15 min  Preparation and documentation time: 5 min  Total time: 20 min

## 2016-02-23 NOTE — Progress Notes (Signed)
Patient ID: Brendan Rogers, male   DOB: 11-09-1965, 50 y.o.   MRN: [id number]    Advanced Heart Failure Clinic Note   Primary Care: Kirstie Peri, MD Primary Cardiologist: Previously Dr Antoine Poche but hasn't followed up in > 2 years Primary HF: Dr. Gala Romney   HPI:  Brendan Rogers is a 50 y.o. male with hx of NICM, HTN, HLD, and alcohol abuse.  Pt reported EF 10% in June 2006, cath with insignificant CAD. Increased to 55-60% in 2008 by echo with medical management.After he improved, he stopped all medicine except for baby ASA and fish oil.  Echo (01/04/16) LVEF 20-25% with akinesis of the anteroseptal myocardium. Mod MR. Mod LAE. Mildly dilated RV. Mod dilated RA, Mod TR, PA peak pressure 51 mm Hg.   Admitted 01/03/16 with volume overload. EF back down in setting of medical noncompliance and ETOH abuse. Meds titrated and diuresed with IV lasix 40 mg BID.  Out 6 L and down 8 lbs. Discharge weight 207 lbs on hospital scales.   He presents today for regular follow up. Overall he feels great. Using an exercise machine at home and still swimming everyday. He's working full time without difficulty. He denies any DOE or CP. Still waiting to see if this job is long term. No lightheadedness or dizziness. No DOE. Denies any alcohol.    CPX 01/13/16 FVC 2.85 (75%)    FEV1 2.38 (78%)     FEV1/FVC 83 (104%)     MVV 121 (85%)  Exercise Time:16:43 Speed (mph): 4.0  Grade (%): 16.5   RPE: 17 Reason stopped: Patient ended test due to overall fatigue. Additional symptoms: Dyspnea (7/10) Resting HR: 101 Peak HR: 170  (100% age predicted max HR) BP rest: 102/78 BP peak: 160/76 Peak VO2: 30.7 (104% predicted peak VO2) VE/VCO2 slope: 31 OUES: 3.10 Peak RER: 1.06 Ventilatory Threshold: 18.9  (64% predicted or measured peak VO2) Peak RR 45 Peak Ventilation: 84.8 VE/MVV: 95% PETCO2 at peak: 38 O2pulse: 17  (106% predicted O2pulse)  Past Medical History  Diagnosis Date  . CHF  (congestive heart failure) (HCC)     secondary to nonischemic cardiomyopathy, EF normal by echcardiogram  . History of ETOH abuse   . Hypertension   . Dyslipidemia     Current Outpatient Prescriptions  Medication Sig Dispense Refill  . aspirin 81 MG tablet Take 81 mg by mouth daily.    . carvedilol (COREG) 3.125 MG tablet Take 1 tablet (3.125 mg total) by mouth 2 (two) times daily with a meal. 60 tablet 3  . digoxin (LANOXIN) 0.125 MG tablet Take 1 tablet (0.125 mg total) by mouth daily. 30 tablet 3  . furosemide (LASIX) 80 MG tablet Take 1 tablet (80 mg total) by mouth daily. 30 tablet 3  . losartan (COZAAR) 25 MG tablet Take 1 tablet (25 mg total) by mouth 2 (two) times daily. 60 tablet 3  . multivitamin (THERAGRAN) per tablet Take 1 tablet by mouth at bedtime.     . Omega-3 Fatty Acids (FISH OIL) 300 MG CAPS Take 1 capsule by mouth at bedtime.     . potassium chloride (K-DUR,KLOR-CON) 20 MEQ tablet Take 1 tablet (20 mEq total) by mouth daily. 30 tablet 3  . spironolactone (ALDACTONE) 25 MG tablet Take 1 tablet (25 mg total) by mouth daily. 30 tablet 3   No current facility-administered medications for this encounter.    No Known Allergies    Social History   Social History  . Marital  Status: Divorced    Spouse Name: N/A  . Number of Children: N/A  . Years of Education: N/A   Occupational History  . Not on file.   Social History Main Topics  . Smoking status: Never Smoker   . Smokeless tobacco: Not on file  . Alcohol Use: Yes     Comment: regularly, including 3 shots a day  . Drug Use: Not on file  . Sexual Activity: Not on file   Other Topics Concern  . Not on file   Social History Narrative      Family History  Problem Relation Age of Onset  . Coronary artery disease Other     Filed Vitals:   02/23/16 1049  BP: 110/72  Pulse: 76  Weight: 199 lb 6.4 oz (90.447 kg)  SpO2: 100%   Wt Readings from Last 3 Encounters:  02/23/16 199 lb 6.4 oz (90.447 kg)    01/26/16 202 lb 12.8 oz (91.989 kg)  01/11/16 208 lb 6.4 oz (94.53 kg)     PHYSICAL EXAM: General:  Well appearing. No respiratory difficulty HEENT: normal Neck: supple. no JVD. Carotids 2+ bilat; no bruits. No thyromegaly or nodule noted. Cor: PMI nondisplaced. Regular rhythm, slightly tachy. No rubs .  2/6 MR Lungs: Clear, normal effort Abdomen: soft, NT, ND, no HSM. No bruits or masses. +BS  Extremities: no cyanosis, clubbing, rash, edema Neuro: alert & oriented x 3, cranial nerves grossly intact. moves all 4 extremities w/o difficulty. Affect pleasant.  ASSESSMENT & PLAN:  1. 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 HTN 3. HLD 4. ETOH abuse - Congratulated on his continued abstinence. 5. DM2 - per primary 6. Obesity  Doing great. Back at work, very active.    Will plan for repeat Echo > October or later with continued med titration.   Stop losartan and start Entresto 24/26. Will work on getting through patient assistance with lack of insurance.   Check BMET today.  Will recheck in 10 days with switch to [facility].  Will also check Dig level then.     Casimiro Needle [address] [place], PA-C 02/23/2016 11:14 AM

## 2016-02-23 NOTE — Patient Instructions (Signed)
Labs today and again in 10 days  START Entresto 24/26 mg, one tab twice a day STOP Losartan   Your physician recommends that you schedule a follow-up appointment in: 6 weeks with Otilio Saber, PA  Do the following things EVERYDAY: 1) Weigh yourself in the morning before breakfast. Write it down and keep it in a log. 2) Take your medicines as prescribed 3) Eat low salt foods-Limit salt (sodium) to 2000 mg per day.  4) Stay as active as you can everyday 5) Limit all fluids for the day to less than 2 liters 6)

## 2016-02-24 NOTE — Addendum Note (Signed)
Encounter addended by: Graciella Freer, PA-C on: 02/24/2016 11:59 AM<BR>     Documentation filed: Follow-up Section, LOS Section

## 2016-03-05 ENCOUNTER — Ambulatory Visit (HOSPITAL_COMMUNITY)
Admission: RE | Admit: 2016-03-05 | Discharge: 2016-03-05 | Disposition: A | Discharge status: Home / Self Care | Payer: Self-pay | Source: Ambulatory Visit | Attending: Cardiology | Admitting: Cardiology

## 2016-03-05 DIAGNOSIS — I5023 Acute on chronic systolic (congestive) heart failure: Secondary | ICD-10-CM

## 2016-03-05 DIAGNOSIS — I5022 Chronic systolic (congestive) heart failure: Secondary | ICD-10-CM | POA: Insufficient documentation

## 2016-03-05 LAB — BASIC METABOLIC PANEL WITH GFR
Anion gap: 6 (ref 5–15)
BUN: 20 mg/dL (ref 6–20)
CO2: 29 mmol/L (ref 22–32)
Calcium: 9.7 mg/dL (ref 8.9–10.3)
Chloride: 105 mmol/L (ref 101–111)
Creatinine, Ser: 0.91 mg/dL (ref 0.61–1.24)
GFR calc Af Amer: >60 mL/min
GFR calc non Af Amer: >60 mL/min
Glucose, Bld: 105 mg/dL — ABNORMAL HIGH (ref 65–99)
Potassium: 3.8 mmol/L (ref 3.5–5.1)
Sodium: 140 mmol/L (ref 135–145)

## 2016-03-05 LAB — DIGOXIN LEVEL

## 2016-03-14 MED FILL — SPIRONOLACTONE 25 MG TABLET: 25 | 34 days supply | Qty: 34 | Fill #2

## 2016-03-14 MED FILL — DIGITEK 125 MCG TABLET: 125 | 34 days supply | Qty: 34 | Fill #2

## 2016-03-28 ENCOUNTER — Telehealth (HOSPITAL_COMMUNITY): Payer: Self-pay | Admitting: Pharmacist

## 2016-03-28 NOTE — Telephone Encounter (Signed)
Entresto 24-26 mg PO BID patient assistance approved so that patient will receive monthly shipments of the medication to his home at no cost to him through 03/26/17.   [name]. Bonnye Fava, PharmD, BCPS, CPP Clinical Pharmacist Pager: [phone] Phone: [phone] 03/28/2016 11:06 AM

## 2016-04-05 ENCOUNTER — Encounter (HOSPITAL_COMMUNITY): Payer: Self-pay

## 2016-04-05 ENCOUNTER — Ambulatory Visit (HOSPITAL_COMMUNITY)
Admission: RE | Admit: 2016-04-05 | Discharge: 2016-04-05 | Disposition: A | Discharge status: Home / Self Care | Payer: Self-pay | Source: Ambulatory Visit | Attending: Cardiology | Admitting: Cardiology

## 2016-04-05 VITALS — BP 106/80 | HR 70 | Wt 202.4 lb

## 2016-04-05 DIAGNOSIS — Z8249 Family history of ischemic heart disease and other diseases of the circulatory system: Secondary | ICD-10-CM | POA: Insufficient documentation

## 2016-04-05 DIAGNOSIS — I5022 Chronic systolic (congestive) heart failure: Secondary | ICD-10-CM | POA: Insufficient documentation

## 2016-04-05 DIAGNOSIS — E785 Hyperlipidemia, unspecified: Secondary | ICD-10-CM | POA: Insufficient documentation

## 2016-04-05 DIAGNOSIS — I10 Essential (primary) hypertension: Secondary | ICD-10-CM

## 2016-04-05 DIAGNOSIS — I110 Hypertensive heart disease with heart failure: Secondary | ICD-10-CM | POA: Insufficient documentation

## 2016-04-05 DIAGNOSIS — Z7982 Long term (current) use of aspirin: Secondary | ICD-10-CM | POA: Insufficient documentation

## 2016-04-05 DIAGNOSIS — I429 Cardiomyopathy, unspecified: Secondary | ICD-10-CM | POA: Insufficient documentation

## 2016-04-05 DIAGNOSIS — F1010 Alcohol abuse, uncomplicated: Secondary | ICD-10-CM | POA: Insufficient documentation

## 2016-04-05 DIAGNOSIS — F1011 Alcohol abuse, in remission: Secondary | ICD-10-CM

## 2016-04-05 DIAGNOSIS — Z9119 Patient's noncompliance with other medical treatment and regimen: Secondary | ICD-10-CM | POA: Insufficient documentation

## 2016-04-05 DIAGNOSIS — E119 Type 2 diabetes mellitus without complications: Secondary | ICD-10-CM | POA: Insufficient documentation

## 2016-04-05 DIAGNOSIS — E669 Obesity, unspecified: Secondary | ICD-10-CM | POA: Insufficient documentation

## 2016-04-05 DIAGNOSIS — R0602 Shortness of breath: Secondary | ICD-10-CM | POA: Insufficient documentation

## 2016-04-05 LAB — BASIC METABOLIC PANEL
ANION GAP: 4 — AB (ref 5–15)
BUN: 22 mg/dL — ABNORMAL HIGH (ref 6–20)
CHLORIDE: 103 mmol/L (ref 101–111)
CO2: 29 mmol/L (ref 22–32)
Calcium: 9.4 mg/dL (ref 8.9–10.3)
Creatinine, Ser: 0.97 mg/dL (ref 0.61–1.24)
GFR calc non Af Amer: >60 mL/min (ref >60)
GLUCOSE: 84 mg/dL (ref 65–99)
POTASSIUM: 4 mmol/L (ref 3.5–5.1)
Sodium: 136 mmol/L (ref 135–145)

## 2016-04-05 NOTE — Progress Notes (Signed)
Patient ID: Brendan Rogers, male   DOB: [birth year], 50 y.o.   MRN: [id number]    Advanced Heart Failure Clinic Note   Primary Care: Kirstie Peri, MD Primary Cardiologist: Previously Dr Antoine Poche but hasn't followed up in > 2 years Primary HF: Dr. Gala Romney   HPI:  Brendan Rogers is a 50 y.o. male with hx of NICM, HTN, HLD, and alcohol abuse.  Pt reported EF 10% in June 2006, cath with insignificant CAD. Increased to 55-60% in 2008 by echo with medical management.After he improved, he stopped all medicine except for baby ASA and fish oil.  Echo (01/04/16) LVEF 20-25% with akinesis of the anteroseptal myocardium. Mod MR. Mod LAE. Mildly dilated RV. Mod dilated RA, Mod TR, PA peak pressure 51 mm Hg.   Admitted 01/03/16 with volume overload. EF back down in setting of medical noncompliance and ETOH abuse. Meds titrated and diuresed with IV lasix 40 mg BID.  Out 6 L and down 8 lbs. Discharge weight 207 lbs on hospital scales.   He presents today for regular follow up. At last visit switched to [facility]. Follow up labs stable.  Denies lightheadedness and dizziness. Weight at home 195.  Weight up 3 lbs from last visit, but stable within that range. Continues to swim daily about 25 minutes. Back at work with no problems, it ends next week, but he has another job that picks up after that. No ETOH.  Denies DOE or CP. No bendopnea or orthopnea. Does not need any extra lasix.   CPX 01/13/16 FVC 2.85 (75%)    FEV1 2.38 (78%)     FEV1/FVC 83 (104%)     MVV 121 (85%)  Exercise Time:16:43 Speed (mph): 4.0  Grade (%): 16.5   RPE: 17 Reason stopped: Patient ended test due to overall fatigue. Additional symptoms: Dyspnea (7/10) Resting HR: 101 Peak HR: 170  (100% age predicted max HR) BP rest: 102/78 BP peak: 160/76 Peak VO2: 30.7 (104% predicted peak VO2) VE/VCO2 slope: 31 OUES: 3.10 Peak RER: 1.06 Ventilatory Threshold: 18.9  (64% predicted or measured peak VO2) Peak RR 45 Peak  Ventilation: 84.8 VE/MVV: 95% PETCO2 at peak: 38 O2pulse: 17  (106% predicted O2pulse)  Past Medical History:  Diagnosis Date  . CHF (congestive heart failure) (HCC)    secondary to nonischemic cardiomyopathy, EF normal by echcardiogram  . Dyslipidemia   . History of ETOH abuse   . Hypertension     Current Outpatient Prescriptions  Medication Sig Dispense Refill  . aspirin 81 MG tablet Take 81 mg by mouth daily.    . carvedilol (COREG) 3.125 MG tablet Take 1 tablet (3.125 mg total) by mouth 2 (two) times daily with a meal. 60 tablet 3  . digoxin (LANOXIN) 0.125 MG tablet Take 1 tablet (0.125 mg total) by mouth daily. 30 tablet 3  . furosemide (LASIX) 80 MG tablet Take 1 tablet (80 mg total) by mouth daily. 30 tablet 3  . multivitamin (THERAGRAN) per tablet Take 1 tablet by mouth at bedtime.     . Omega-3 Fatty Acids (FISH OIL) 300 MG CAPS Take 1 capsule by mouth at bedtime.     . potassium chloride (K-DUR,KLOR-CON) 20 MEQ tablet Take 1 tablet (20 mEq total) by mouth daily. 30 tablet 3  . sacubitril-valsartan (ENTRESTO) 24-26 MG Take 1 tablet by mouth 2 (two) times daily. 60 tablet 11  . spironolactone (ALDACTONE) 25 MG tablet Take 1 tablet (25 mg total) by mouth daily. 30 tablet 3  No current facility-administered medications for this encounter.     No Known Allergies    Social History   Social History  . Marital status: Divorced    Spouse name: N/A  . Number of children: N/A  . Years of education: N/A   Occupational History  . Not on file.   Social History Main Topics  . Smoking status: Never Smoker  . Smokeless tobacco: Not on file  . Alcohol use Yes     Comment: regularly, including 3 shots a day  . Drug use: Unknown  . Sexual activity: Not on file   Other Topics Concern  . Not on file   Social History Narrative  . No narrative on file      Family History  Problem Relation Age of Onset  . Coronary artery disease Other     Vitals:   04/05/16  0952  BP: 106/80  Pulse: 70  SpO2: 97%  Weight: 202 lb 6.4 oz (91.8 kg)   Wt Readings from Last 3 Encounters:  04/05/16 202 lb 6.4 oz (91.8 kg)  02/23/16 199 lb 6.4 oz (90.4 kg)  01/26/16 202 lb 12.8 oz (92 kg)     PHYSICAL EXAM: General:  Well appearing. No respiratory difficulty HEENT: normal Neck: supple. no JVD. Carotids 2+ bilat; no bruits. No thyromegaly or lymphadenopathy appreicated. Cor: PMI nondisplaced. RRR, No gallops or rubs appreciated. 2/6 MR Lungs: CTAB, normal effort. Abdomen: soft, non-tender, non-distended, no HSM. No bruits or masses. +BS  Extremities: no cyanosis, clubbing, rash. No peripheral edema.  Neuro: alert & oriented x 3, cranial nerves grossly intact. moves all 4 extremities w/o difficulty. Affect pleasant.  ASSESSMENT & PLAN:  1. 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 HTN 3. HLD 4. ETOH abuse - remains abstinent.  5. DM2 - per primary 6. Obesity  He continues to do great and progress.  Mild weight gain seems like muscle with increased work, does not appear to have volume on exam.   Continue Entresto 24/26 with borderline BP.  Will increase Coreg to 6.25 mg BID  Continue digoxin 0.125 mg. Level < 0.2 on 03/05/16. Continue spiro 25 mg daily.   BMET today. Follow up 2 months ( first of October) with Echo.   Brendan Needle "Mardelle Rogers" [place], PA-C 04/05/2016 10:23 AM   Total time spent > 25 minutes, over half that spent discussing the above.

## 2016-04-05 NOTE — Patient Instructions (Signed)
Routine lab work today. Will notify you of abnormal results, otherwise no news is good news!  Follow up October with echocardiogram and appointment with Dr. Gala Romney.  Do the following things EVERYDAY: 1) Weigh yourself in the morning before breakfast. Write it down and keep it in a log. 2) Take your medicines as prescribed 3) Eat low salt foods-Limit salt (sodium) to 2000 mg per day.  4) Stay as active as you can everyday 5) Limit all fluids for the day to less than 2 liters

## 2016-04-20 ENCOUNTER — Telehealth (HOSPITAL_COMMUNITY): Payer: Self-pay | Admitting: *Deleted

## 2016-04-20 NOTE — Telephone Encounter (Signed)
[name] called and left a voice message about pts medication cost. She stated some medications he can not afford. Please call her back at [phone]

## 2016-04-23 MED ORDER — DIGOXIN 125 MCG PO TABS: 0.1250 mg | ORAL_TABLET | Freq: Every day | ORAL | 3 refills | Status: DC

## 2016-04-23 NOTE — Telephone Encounter (Signed)
Left VM for [name] to call back.   [name]. Bonnye Fava, PharmD, BCPS, CPP Clinical Pharmacist Pager: [phone] Phone: [phone] 04/23/2016 9:32 AM

## 2016-04-23 NOTE — Telephone Encounter (Signed)
Spoke with [name] (significant other) who stated that [facility] is now charging Brendan Rogers $38/mo for his digoxin which is too expensive for him. 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 I have offered to send his digoxin Rx to our Outpatient Pharmacy so that he can get this medication through our HF fund at no charge to him until he gets insurance. [name] verbalized understanding and was thankful for the assistance.   [name]. Bonnye Fava, PharmD, BCPS, CPP Clinical Pharmacist Pager: [phone] Phone: [phone] 04/23/2016 2:14 PM

## 2016-04-23 NOTE — Addendum Note (Signed)
Addended by: Elvin So on: 04/23/2016 02:20 PM   Modules accepted: Orders

## 2016-04-27 ENCOUNTER — Other Ambulatory Visit (HOSPITAL_COMMUNITY): Payer: Self-pay

## 2016-04-27 DIAGNOSIS — I5023 Acute on chronic systolic (congestive) heart failure: Secondary | ICD-10-CM

## 2016-04-27 MED ORDER — CARVEDILOL 6.25 MG PO TABS: 6.2500 mg | ORAL_TABLET | Freq: Two times a day (BID) | ORAL | 6 refills | Status: DC

## 2016-04-27 MED FILL — CARVEDILOL 6.25 MG TABLET: 6.25 | 30 days supply | Qty: 60 | Fill #0

## 2016-05-01 MED FILL — FUROSEMIDE 80 MG TABLET: 80 | 100 days supply | Qty: 100 | Fill #1

## 2016-05-01 MED FILL — DIGITEK 125 MCG TABLET: 125 | 30 days supply | Qty: 30 | Fill #0

## 2016-05-29 ENCOUNTER — Telehealth (HOSPITAL_COMMUNITY): Payer: Self-pay | Admitting: Pharmacist

## 2016-05-29 NOTE — Telephone Encounter (Signed)
Received a call from [facility] stating that Brendan Rogers, [name]'s significant other, was unaware that his dose of carvedilol was increased from 3.125 mg BID to 6.25 mg BID. Per [name]'s last clinic visit note, he was supposed to increase to the 6.25 mg BID. Verified this again with Mardelle Matte and advised [name] to go ahead and increase his dose as prescribed and to give [place] a call if he has any side effects including excessive dizziness/fatigue. She verbalized understanding and will increase the dose as directed.   [name]. Bonnye Fava, PharmD, BCPS, CPP Clinical Pharmacist Pager: [phone] Phone: [phone] 05/29/2016 4:40 PM

## 2016-06-01 MED FILL — DIGITEK 125 MCG TABLET: 125 | 30 days supply | Qty: 30 | Fill #1

## 2016-06-18 ENCOUNTER — Other Ambulatory Visit (HOSPITAL_COMMUNITY): Payer: Self-pay | Admitting: Pharmacist

## 2016-06-18 MED ORDER — POTASSIUM CHLORIDE CRYS ER 20 MEQ PO TBCR: 20.0000 meq | EXTENDED_RELEASE_TABLET | Freq: Every day | ORAL | 5 refills | Status: DC

## 2016-06-26 MED FILL — CARVEDILOL 6.25 MG TABLET: 6.25 | 30 days supply | Qty: 60 | Fill #1

## 2016-07-05 MED FILL — DIGITEK 125 MCG TABLET: 125 | 30 days supply | Qty: 30 | Fill #2

## 2016-07-30 MED FILL — DIGITEK 125 MCG TABLET: 125 | 30 days supply | Qty: 30 | Fill #3

## 2016-07-30 MED FILL — CARVEDILOL 6.25 MG TABLET: 6.25 | 30 days supply | Qty: 60 | Fill #2

## 2016-08-21 ENCOUNTER — Other Ambulatory Visit (HOSPITAL_COMMUNITY): Payer: Self-pay | Admitting: Pharmacist

## 2016-08-21 MED ORDER — SPIRONOLACTONE 25 MG PO TABS: 25.0000 mg | ORAL_TABLET | Freq: Every day | ORAL | 5 refills | Status: DC

## 2016-08-31 ENCOUNTER — Ambulatory Visit (HOSPITAL_COMMUNITY)
Admission: RE | Admit: 2016-08-31 | Discharge: 2016-08-31 | Disposition: A | Discharge status: Home / Self Care | Payer: Self-pay | Source: Ambulatory Visit | Attending: Internal Medicine | Admitting: Internal Medicine

## 2016-08-31 ENCOUNTER — Other Ambulatory Visit (HOSPITAL_COMMUNITY): Payer: Self-pay | Admitting: Internal Medicine

## 2016-08-31 VITALS — BP 116/72 | HR 67 | Wt 209.0 lb

## 2016-08-31 DIAGNOSIS — Z8249 Family history of ischemic heart disease and other diseases of the circulatory system: Secondary | ICD-10-CM | POA: Insufficient documentation

## 2016-08-31 DIAGNOSIS — Z79899 Other long term (current) drug therapy: Secondary | ICD-10-CM | POA: Insufficient documentation

## 2016-08-31 DIAGNOSIS — I110 Hypertensive heart disease with heart failure: Secondary | ICD-10-CM | POA: Insufficient documentation

## 2016-08-31 DIAGNOSIS — E785 Hyperlipidemia, unspecified: Secondary | ICD-10-CM | POA: Insufficient documentation

## 2016-08-31 DIAGNOSIS — Z9119 Patient's noncompliance with other medical treatment and regimen: Secondary | ICD-10-CM | POA: Insufficient documentation

## 2016-08-31 DIAGNOSIS — I426 Alcoholic cardiomyopathy: Secondary | ICD-10-CM | POA: Insufficient documentation

## 2016-08-31 DIAGNOSIS — I5022 Chronic systolic (congestive) heart failure: Secondary | ICD-10-CM

## 2016-08-31 DIAGNOSIS — F1010 Alcohol abuse, uncomplicated: Secondary | ICD-10-CM | POA: Insufficient documentation

## 2016-08-31 DIAGNOSIS — Z7982 Long term (current) use of aspirin: Secondary | ICD-10-CM | POA: Insufficient documentation

## 2016-08-31 DIAGNOSIS — E119 Type 2 diabetes mellitus without complications: Secondary | ICD-10-CM | POA: Insufficient documentation

## 2016-08-31 LAB — ECHOCARDIOGRAM COMPLETE
Ao-asc: 33 cm
CHL CUP RV SYS PRESS: 26 mmHg
E decel time: 271 msec
E/e' ratio: 4.46
FS: 10 % — AB (ref 28–44)
IVS/LV PW RATIO, ED: 1.28
LA diam end sys: 33 mm
LA vol index: 37.3 mL/m2
LADIAMINDEX: 1.63 cm/m2
LASIZE: 33 mm
LAVOL: 75.7 mL
LAVOLA4C: 72 mL
LV E/e'average: 4.46
LV PW d: 11.4 mm — AB (ref 0.6–1.1)
LV TDI E'LATERAL: 13.8
LV e' LATERAL: 13.8 cm/s
LVEEMED: 4.46
LVOT VTI: 25.8 cm
LVOT area: 3.8 cm2
LVOT diameter: 22 mm
LVOT peak grad rest: 7 mmHg
LVOTPV: 128 cm/s
LVOTSV: 98 mL
MV Dec: 271
MV pk A vel: 75.7 m/s
MVPKEVEL: 61.6 m/s
RV LATERAL S' VELOCITY: 12.9 cm/s
Reg peak vel: 241 cm/s
S' Lateral: 8.59 cm/s
TAPSE: 19.3 mm
TDI e' medial: 6.31
TR max vel: 241 cm/s

## 2016-08-31 LAB — BASIC METABOLIC PANEL
ANION GAP: 8 (ref 5–15)
BUN: 21 mg/dL — ABNORMAL HIGH (ref 6–20)
CALCIUM: 9.4 mg/dL (ref 8.9–10.3)
CO2: 24 mmol/L (ref 22–32)
Chloride: 107 mmol/L (ref 101–111)
Creatinine, Ser: 1.03 mg/dL (ref 0.61–1.24)
GLUCOSE: 102 mg/dL — AB (ref 65–99)
POTASSIUM: 4.1 mmol/L (ref 3.5–5.1)
Sodium: 139 mmol/L (ref 135–145)

## 2016-08-31 LAB — DIGOXIN LEVEL: DIGOXIN LVL: 0.3 ng/mL — AB (ref 0.8–2.0)

## 2016-08-31 MED ORDER — DIGOXIN 125 MCG PO TABS: 0.1250 mg | ORAL_TABLET | Freq: Every day | ORAL | 3 refills | Status: DC

## 2016-08-31 MED ORDER — SACUBITRIL-VALSARTAN 49-51 MG PO TABS: 1.0000 | ORAL_TABLET | Freq: Two times a day (BID) | ORAL | 3 refills | Status: DC

## 2016-08-31 MED FILL — CARVEDILOL 6.25 MG TABLET: 6.25 | 30 days supply | Qty: 60 | Fill #3

## 2016-08-31 MED FILL — DIGITEK 125 MCG TABLET: 125 | 30 days supply | Qty: 30 | Fill #0

## 2016-08-31 NOTE — Progress Notes (Signed)
  Echocardiogram 2D Echocardiogram has been performed.  Brendan Rogers 08/31/2016, 10:54 AM

## 2016-08-31 NOTE — Addendum Note (Signed)
Encounter addended by: Noralee Space, RN on: 08/31/2016 11:54 AM<BR>    Actions taken: Pharmacy for encounter modified, Order list changed, Diagnosis association updated, Sign clinical note

## 2016-08-31 NOTE — Patient Instructions (Signed)
Increase Entresto to 49/51 mg Twice daily   Labs today  Your physician recommends that you schedule a follow-up appointment in: 3 months

## 2016-08-31 NOTE — Progress Notes (Signed)
Patient ID: Brendan Rogers, male   DOB: 05-09-1966, 50 y.o.   MRN: [id number]    Advanced Heart Failure Clinic Note   Primary Care: Kirstie PeriAshish Shah, MD Primary Cardiologist: Previously Dr Antoine PocheHochrein but hasn't followed up in > 2 years Primary HF: Dr. Gala RomneyBensimhon   HPI:  Brendan Rogers is a 50 y.o. male with hx of NICM, HTN, HLD, and alcohol abuse.  Pt reported EF 10% in June 2006, cath with insignificant CAD. Increased to 55-60% in 2008 by echo with medical management.After he improved, he stopped all medicine except for baby ASA and fish oil.  Echo (01/04/16) LVEF 20-25% with akinesis of the anteroseptal myocardium. Mod MR. Mod LAE. Mildly dilated RV. Mod dilated RA, Mod TR, PA peak pressure 51 mm Hg.   Admitted 01/03/16 with volume overload. EF back down in setting of medical noncompliance and ETOH abuse. Meds titrated and diuresed with IV lasix 40 mg BID.  Out 6 L and down 8 lbs. Discharge weight 207 lbs on hospital scales.   He presents today for regular follow up. [facility]. Carvedilol increased at last visit. Feels good. [place] about 30 minutes. Also does elliptical and weights. No SOB or CP. No edema. Got laid off from job. Does not need any extra lasix.   Echo today reviewed personally - preliminary EF 30-35% RV normal   CPX 01/13/16 FVC 2.85 (75%)    FEV1 2.38 (78%)     FEV1/FVC 83 (104%)     MVV 121 (85%)  Exercise Time:16:43 Speed (mph): 4.0  Grade (%): 16.5   RPE: 17 Reason stopped: Patient ended test due to overall fatigue. Additional symptoms: Dyspnea (7/10) Resting HR: 101 Peak HR: 170  (100% age predicted max HR) BP rest: 102/78 BP peak: 160/76 Peak VO2: 30.7 (104% predicted peak VO2) VE/VCO2 slope: 31 OUES: 3.10 Peak RER: 1.06 Ventilatory Threshold: 18.9  (64% predicted or measured peak VO2) Peak RR 45 Peak Ventilation: 84.8 VE/MVV: 95% PETCO2 at peak: 38 O2pulse: 17  (106% predicted O2pulse)  Past  Medical History:  Diagnosis Date  . CHF (congestive heart failure) (HCC)    secondary to nonischemic cardiomyopathy, EF normal by echcardiogram  . Dyslipidemia   . History of ETOH abuse   . Hypertension     Current Outpatient Prescriptions  Medication Sig Dispense Refill  . aspirin 81 MG tablet Take 81 mg by mouth daily.    . carvedilol (COREG) 6.25 MG tablet Take 1 tablet (6.25 mg total) by mouth 2 (two) times daily with a meal. 60 tablet 6  . digoxin (LANOXIN) 0.125 MG tablet Take 1 tablet (0.125 mg total) by mouth daily. 30 tablet 3  . furosemide (LASIX) 80 MG tablet Take 1 tablet (80 mg total) by mouth daily. 30 tablet 3  . multivitamin (THERAGRAN) per tablet Take 1 tablet by mouth at bedtime.     . Omega-3 Fatty Acids (FISH OIL) 300 MG CAPS Take 1 capsule by mouth at bedtime.     . potassium chloride SA (K-DUR,KLOR-CON) 20 MEQ tablet Take 1 tablet (20 mEq total) by mouth daily. 30 tablet 5  . sacubitril-valsartan (ENTRESTO) 24-26 MG Take 1 tablet by mouth 2 (two) times daily. 60 tablet 11  . spironolactone (ALDACTONE) 25 MG tablet Take 1 tablet (25 mg total) by mouth daily. 30 tablet 5   No current facility-administered medications for this encounter.     No Known Allergies    Social History  Social History  . Marital status: Divorced    Spouse name: N/A  . Number of children: N/A  . Years of education: N/A   Occupational History  . Not on file.   Social History Main Topics  . Smoking status: Never Smoker  . Smokeless tobacco: Not on file  . Alcohol use Yes     Comment: regularly, including 3 shots a day  . Drug use: Unknown  . Sexual activity: Not on file   Other Topics Concern  . Not on file   Social History Narrative  . No narrative on file      Family History  Problem Relation Age of Onset  . Coronary artery disease Other     Vitals:   08/31/16 1125  BP: 116/72  Pulse: 67  SpO2: 98%  Weight: 209 lb (94.8 kg)   Wt Readings from Last 3  Encounters:  08/31/16 209 lb (94.8 kg)  04/05/16 202 lb 6.4 oz (91.8 kg)  02/23/16 199 lb 6.4 oz (90.4 kg)     PHYSICAL EXAM: General:  Well appearing. No respiratory difficulty HEENT: normal Neck: supple. no JVD. Carotids 2+ bilat; no bruits. No thyromegaly or lymphadenopathy appreicated. Cor: PMI nondisplaced. RRR, No gallops or rubs appreciated. 2/6 MR Lungs: CTAB, normal effort. Abdomen: soft, non-tender, non-distended, no HSM. No bruits or masses. +BS  Extremities: no cyanosis, clubbing, rash. No peripheral edema.  Neuro: alert & oriented x 3, cranial nerves grossly intact. moves all 4 extremities w/o difficulty. Affect pleasant.  ASSESSMENT & PLAN:  1. 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 Echo today EF 30-35% --NYHA I. Volume status looks good despite weight gain.  --EF improving slowly.  --Increase Entresto to 49/51 --Continue Coreg to 6.25 mg BID  --Continue digoxin 0.125 mg. Level < 0.2 on 03/05/16. --Continue spiro 25 mg daily.  --No ICD with NYHA and improving EF --Check labs today 2. HTN --Wellcontrolled 3. HLD 4. ETOH abuse - remains abstinent.  5. DM2 - per primary   Bensimhon, Daniel,MD 11:36 AM

## 2016-09-04 ENCOUNTER — Telehealth (HOSPITAL_COMMUNITY): Payer: Self-pay | Admitting: Pharmacist

## 2016-09-04 MED ORDER — SACUBITRIL-VALSARTAN 49-51 MG PO TABS: 1.0000 | ORAL_TABLET | Freq: Two times a day (BID) | ORAL | 3 refills | Status: DC

## 2016-09-04 NOTE — Telephone Encounter (Signed)
Rinaldo Cloud called stating that Entresto dose recently increased by Dr. Gala Romney to 49-51 mg BID. Will need to fax the new Rx to [employer] patient assistance and provide them with samples until new dose processed and mailed by [employer].   Medication Samples have been provided to the patient.  Drug name: Sherryll Burger       Strength: 49-51 mg        Qty: 28  LOT: [id number]  Exp.Date: 7/19  Dosing instructions: Take 1 tablet twice daily   The patient has been instructed regarding the correct time, dose, and frequency of taking this medication, including desired effects and most common side effects.    [name]. Bonnye Fava, PharmD, BCPS, CPP Clinical Pharmacist Pager: [phone] Phone: [phone] 09/04/2016 11:19 AM

## 2016-09-20 ENCOUNTER — Telehealth (HOSPITAL_COMMUNITY): Payer: Self-pay | Admitting: Pharmacist

## 2016-09-20 NOTE — Telephone Encounter (Signed)
Rinaldo Cloud (patient's girlfriend) called stating that they have not received Anastasio's higher dose of Entresto from [employer] patient assistance yet. I have called [employer] and they stated that they called yesterday to try to set up shipping. I have advised Rinaldo Cloud to call them today to set up shipment and call me back with any further issues.   [name]. Bonnye Fava, PharmD, BCPS, CPP Clinical Pharmacist Pager: [phone] Phone: [phone] 09/20/2016 4:33 PM

## 2016-10-05 MED FILL — CARVEDILOL 6.25 MG TABLET: 6.25 | 30 days supply | Qty: 60 | Fill #4

## 2016-10-05 MED FILL — FUROSEMIDE 80 MG TABLET: 80 | 100 days supply | Qty: 100 | Fill #2

## 2016-10-05 MED FILL — DIGITEK 125 MCG TABLET: 125 | 30 days supply | Qty: 30 | Fill #1

## 2016-11-07 MED FILL — CARVEDILOL 6.25 MG TABLET: 6.25 | 30 days supply | Qty: 60 | Fill #5

## 2016-11-07 MED FILL — DIGITEK 125 MCG TABLET: 125 | 30 days supply | Qty: 30 | Fill #2

## 2016-11-29 ENCOUNTER — Encounter (HOSPITAL_COMMUNITY): Payer: Self-pay

## 2016-12-06 MED FILL — DIGITEK 125 MCG TABLET: 125 | 30 days supply | Qty: 30 | Fill #3

## 2016-12-06 MED FILL — CARVEDILOL 6.25 MG TABLET: 6.25 | 30 days supply | Qty: 60 | Fill #6

## 2016-12-18 ENCOUNTER — Ambulatory Visit (HOSPITAL_COMMUNITY)
Admission: RE | Admit: 2016-12-18 | Discharge: 2016-12-18 | Disposition: A | Discharge status: Home / Self Care | Payer: Self-pay | Source: Ambulatory Visit | Attending: Cardiology | Admitting: Cardiology

## 2016-12-18 VITALS — BP 116/88 | HR 77 | Wt 205.6 lb

## 2016-12-18 DIAGNOSIS — I110 Hypertensive heart disease with heart failure: Secondary | ICD-10-CM | POA: Insufficient documentation

## 2016-12-18 DIAGNOSIS — F1011 Alcohol abuse, in remission: Secondary | ICD-10-CM

## 2016-12-18 DIAGNOSIS — E119 Type 2 diabetes mellitus without complications: Secondary | ICD-10-CM | POA: Insufficient documentation

## 2016-12-18 DIAGNOSIS — I5022 Chronic systolic (congestive) heart failure: Secondary | ICD-10-CM | POA: Insufficient documentation

## 2016-12-18 DIAGNOSIS — Z9119 Patient's noncompliance with other medical treatment and regimen: Secondary | ICD-10-CM | POA: Insufficient documentation

## 2016-12-18 DIAGNOSIS — Z87898 Personal history of other specified conditions: Secondary | ICD-10-CM

## 2016-12-18 DIAGNOSIS — Z8249 Family history of ischemic heart disease and other diseases of the circulatory system: Secondary | ICD-10-CM | POA: Insufficient documentation

## 2016-12-18 DIAGNOSIS — I10 Essential (primary) hypertension: Secondary | ICD-10-CM

## 2016-12-18 DIAGNOSIS — I429 Cardiomyopathy, unspecified: Secondary | ICD-10-CM | POA: Insufficient documentation

## 2016-12-18 DIAGNOSIS — Z79899 Other long term (current) drug therapy: Secondary | ICD-10-CM | POA: Insufficient documentation

## 2016-12-18 DIAGNOSIS — E785 Hyperlipidemia, unspecified: Secondary | ICD-10-CM | POA: Insufficient documentation

## 2016-12-18 DIAGNOSIS — Z7982 Long term (current) use of aspirin: Secondary | ICD-10-CM | POA: Insufficient documentation

## 2016-12-18 LAB — BASIC METABOLIC PANEL
Anion gap: 10 (ref 5–15)
BUN: 27 mg/dL — AB (ref 6–20)
CO2: 26 mmol/L (ref 22–32)
CREATININE: 1.19 mg/dL (ref 0.61–1.24)
Calcium: 9.6 mg/dL (ref 8.9–10.3)
Chloride: 101 mmol/L (ref 101–111)
Glucose, Bld: 106 mg/dL — ABNORMAL HIGH (ref 65–99)
POTASSIUM: 3.8 mmol/L (ref 3.5–5.1)
SODIUM: 137 mmol/L (ref 135–145)

## 2016-12-18 LAB — DIGOXIN LEVEL: Digoxin Level: 0.3 ng/mL — ABNORMAL LOW (ref 0.8–2.0)

## 2016-12-18 MED ORDER — FUROSEMIDE 40 MG PO TABS: 40.0000 mg | ORAL_TABLET | Freq: Every day | ORAL | 3 refills | Status: DC

## 2016-12-18 MED ORDER — SACUBITRIL-VALSARTAN 97-103 MG PO TABS: 1.0000 | ORAL_TABLET | Freq: Two times a day (BID) | ORAL | 3 refills | Status: DC

## 2016-12-18 NOTE — Progress Notes (Signed)
Advanced Heart Failure Medication Review by a Pharmacist  Does the patient  feel that his/her medications are working for him/her?  yes  Has the patient been experiencing any side effects to the medications prescribed?  no  Does the patient measure his/her own blood pressure or blood glucose at home?  no   Does the patient have any problems obtaining medications due to transportation or finances?   Yes - no Rx insurance, can afford meds through [employer] and getting Entresto through [employer] patient assistance  Understanding of regimen: fair Understanding of indications: fair Potential of compliance: good Patient understands to avoid NSAIDs. Patient understands to avoid decongestants.  Issues to address at subsequent visits: None   Pharmacist comments: [name] is a pleasant 51 yo M presenting without a medication list. He reports good compliance with his regimen and did not have any specific medication-related questions or concerns for me at this time.   [name]. Bonnye Fava, PharmD, BCPS, CPP Clinical Pharmacist Pager: [phone] Phone: [phone] 12/18/2016 3:50 PM      Time with patient: 10 minutes Preparation and documentation time: 2 minutes Total time: 12 minutes

## 2016-12-18 NOTE — Patient Instructions (Addendum)
Routine lab work today. Will notify you of abnormal results, otherwise no news is good news!  DECREASE lasix to 40 mg once daily. May half current 80 mg tablets (Take 1/2 tablet once daily). New Rx has been sent to pharmacy for 40 mg tablets (Take 1 tablets once daily).  INCREASE Entresto to 97/103 mg tablets (Take 1 tablet twice daily). This has been ordered through mail order pharmacy.  Return in 2 weeks for repeat labs.  Follow up 3 months with echocardiogram and appointment with Dr. Gala Romney.  Do the following things EVERYDAY: 1) Weigh yourself in the morning before breakfast. Write it down and keep it in a log. 2) Take your medicines as prescribed 3) Eat low salt foods-Limit salt (sodium) to 2000 mg per day.  4) Stay as active as you can everyday 5) Limit all fluids for the day to less than 2 liters

## 2016-12-18 NOTE — Progress Notes (Signed)
Patient ID: MARCELO ICKES, male   DOB: Jun 28, 1966, 51 y.o.   MRN: [id number]    Advanced Heart Failure Clinic Note   Primary Care: Kirstie Peri, MD Primary Cardiologist: Previously Dr Antoine Poche but hasn't followed up in > 2 years Primary HF: Dr. Gala Romney   HPI:  ERCIL CASSIS is a 51 y.o. male with hx of NICM, HTN, HLD, and alcohol abuse.  Pt reported EF 10% in June 2006, cath with insignificant CAD. Increased to 55-60% in 2008 by echo with medical management.After he improved, he stopped all medicine except for baby ASA and fish oil.  Echo (01/04/16) LVEF 20-25% with akinesis of the anteroseptal myocardium. Mod MR. Mod LAE. Mildly dilated RV. Mod dilated RA, Mod TR, PA peak pressure 51 mm Hg.   Admitted 01/03/16 with volume overload. EF back down in setting of medical noncompliance and ETOH abuse. Meds titrated and diuresed with IV lasix 40 mg BID.  Out 6 L and down 8 lbs. Discharge weight 207 lbs on hospital scales.   Presents today for HF follow up. Weight stable, down 4 pounds since last visit. Goes to the [facility] 3-4 times a week and swims as well as lifts light weights. No SOB with that. Uses 2 pillows for sleep, denies PND. Denies lower extremity edema and chest pain. Taking medications with compliance. Drinking more than 2 liters a day, eats at home. Tries to follow low salt diet.   CPX 01/13/16 FVC 2.85 (75%)    FEV1 2.38 (78%)     FEV1/FVC 83 (104%)     MVV 121 (85%)  Exercise Time:16:43 Speed (mph): 4.0  Grade (%): 16.5   RPE: 17 Reason stopped: Patient ended test due to overall fatigue. Additional symptoms: Dyspnea (7/10) Resting HR: 101 Peak HR: 170  (100% age predicted max HR) BP rest: 102/78 BP peak: 160/76 Peak VO2: 30.7 (104% predicted peak VO2) VE/VCO2 slope: 31 OUES: 3.10 Peak RER: 1.06 Ventilatory Threshold: 18.9  (64% predicted or measured peak VO2) Peak RR 45 Peak Ventilation: 84.8 VE/MVV: 95% PETCO2 at peak: 38 O2pulse: 17  (106%  predicted O2pulse)  Past Medical History:  Diagnosis Date  . CHF (congestive heart failure) (HCC)    secondary to nonischemic cardiomyopathy, EF normal by echcardiogram  . Dyslipidemia   . History of ETOH abuse   . Hypertension     Current Outpatient Prescriptions  Medication Sig Dispense Refill  . aspirin 81 MG tablet Take 81 mg by mouth daily.    . carvedilol (COREG) 6.25 MG tablet Take 1 tablet (6.25 mg total) by mouth 2 (two) times daily with a meal. 60 tablet 6  . digoxin (LANOXIN) 0.125 MG tablet Take 1 tablet (0.125 mg total) by mouth daily. 30 tablet 3  . furosemide (LASIX) 40 MG tablet Take 1 tablet (40 mg total) by mouth daily. 90 tablet 3  . multivitamin (THERAGRAN) per tablet Take 1 tablet by mouth at bedtime.     . Omega-3 Fatty Acids (FISH OIL) 300 MG CAPS Take 1 capsule by mouth at bedtime.     . potassium chloride SA (K-DUR,KLOR-CON) 20 MEQ tablet Take 1 tablet (20 mEq total) by mouth daily. 30 tablet 5  . spironolactone (ALDACTONE) 25 MG tablet Take 1 tablet (25 mg total) by mouth daily. 30 tablet 5  . sacubitril-valsartan (ENTRESTO) 97-103 MG Take 1 tablet by mouth 2 (two) times daily. 180 tablet 3   No current facility-administered medications for this encounter.     No Known Allergies  Social History   Social History  . Marital status: Divorced    Spouse name: N/A  . Number of children: N/A  . Years of education: N/A   Occupational History  . Not on file.   Social History Main Topics  . Smoking status: Never Smoker  . Smokeless tobacco: Not on file  . Alcohol use Yes     Comment: regularly, including 3 shots a day  . Drug use: Unknown  . Sexual activity: Not on file   Other Topics Concern  . Not on file   Social History Narrative  . No narrative on file      Family History  Problem Relation Age of Onset  . Coronary artery disease Other     Vitals:   12/18/16 1524  BP: 116/88  Pulse: 77  SpO2: 100%  Weight: 205 lb 9.6 oz (93.3 kg)    Wt Readings from Last 3 Encounters:  12/18/16 205 lb 9.6 oz (93.3 kg)  08/31/16 209 lb (94.8 kg)  04/05/16 202 lb 6.4 oz (91.8 kg)     PHYSICAL EXAM: General:  Well appearing male, walked into clinic without SOB.  HEENT: normal, atraumatic.  Neck: supple. No JVD. Carotids 2+ bilat; no bruits. No thyromegaly or lymphadenopathy appreicated. Cor: PMI nondisplaced. RRR, No gallops or rubs appreciated. 2/6 systolic murmur.  Lungs: CTAB, normal effort. Abdomen: soft, non-tender, non-distended, no HSM. No bruits or masses. +BS  Extremities: no cyanosis, clubbing, rash. No peripheral edema.  Neuro: alert & oriented x 3, cranial nerves grossly intact. moves all 4 extremities w/o difficulty. Affect pleasant.  ASSESSMENT & PLAN:  1. 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 Echo EF 30-35% in Dec. 2017.  --NYHA I.  --Increase Entresto to 97/103 mg BID, will cut Lasix down to 40mg  daily with the increase in Entresto. Will get repeat BMET in 10 days.  --Continue Coreg to 6.25 mg BID  --Continue digoxin, will get dig level today.  --Continue spiro 25 mg daily.  --No ICD with NYHA and improving EF --Repeat Echo in 3 months.  2. HTN --Well controlled 3. HLD: on omega 3 fatty acids, continue same.  4. ETOH abuse - Denies ETOH use.  5. DM2 - per primary  Follow up in 3 months with Echo.   Little Ishikawa, NP-C 3:56 PM

## 2016-12-24 ENCOUNTER — Telehealth (HOSPITAL_COMMUNITY): Payer: Self-pay | Admitting: Pharmacist

## 2016-12-24 NOTE — Telephone Encounter (Signed)
Patient's girlfriend, Christin Bach, called asking if Mr. Hovanec increased Sherryll Burger dose (97-103 mg BID) was coming in the mail. I have informed her that it was sent electronically to [employer] patient assistance pharmacy and advised her to call them to verify shipping at [phone]. She verbalized understanding and will call me back with any issues.   [name]. Bonnye Fava, PharmD, BCPS, CPP Clinical Pharmacist Pager: [phone] Phone: [phone] 12/24/2016 9:59 AM

## 2016-12-28 ENCOUNTER — Other Ambulatory Visit (HOSPITAL_COMMUNITY): Payer: Self-pay

## 2017-01-10 ENCOUNTER — Other Ambulatory Visit (HOSPITAL_COMMUNITY): Payer: Self-pay | Admitting: Internal Medicine

## 2017-01-10 ENCOUNTER — Other Ambulatory Visit (HOSPITAL_COMMUNITY): Payer: Self-pay | Admitting: Student

## 2017-01-10 ENCOUNTER — Ambulatory Visit (HOSPITAL_COMMUNITY)
Admission: RE | Admit: 2017-01-10 | Discharge: 2017-01-10 | Disposition: A | Discharge status: Home / Self Care | Payer: Self-pay | Source: Ambulatory Visit | Attending: Internal Medicine | Admitting: Internal Medicine

## 2017-01-10 DIAGNOSIS — I5023 Acute on chronic systolic (congestive) heart failure: Secondary | ICD-10-CM

## 2017-01-10 DIAGNOSIS — I5022 Chronic systolic (congestive) heart failure: Secondary | ICD-10-CM | POA: Insufficient documentation

## 2017-01-10 LAB — BASIC METABOLIC PANEL
Anion gap: 4 — ABNORMAL LOW (ref 5–15)
BUN: 21 mg/dL — AB (ref 6–20)
CHLORIDE: 103 mmol/L (ref 101–111)
CO2: 29 mmol/L (ref 22–32)
CREATININE: 1.07 mg/dL (ref 0.61–1.24)
Calcium: 9.3 mg/dL (ref 8.9–10.3)
GFR calc Af Amer: >60 mL/min (ref >60)
GFR calc non Af Amer: >60 mL/min (ref >60)
GLUCOSE: 112 mg/dL — AB (ref 65–99)
POTASSIUM: 3.9 mmol/L (ref 3.5–5.1)
SODIUM: 136 mmol/L (ref 135–145)

## 2017-01-10 MED ORDER — CARVEDILOL 6.25 MG PO TABS: 6.2500 mg | ORAL_TABLET | Freq: Two times a day (BID) | ORAL | 3 refills | Status: DC

## 2017-01-10 MED FILL — CARVEDILOL 6.25 MG TABLET: 6.25 | 30 days supply | Qty: 60 | Fill #0

## 2017-01-10 MED FILL — DIGOXIN 0.125 MG TABLET: 125 | 30 days supply | Qty: 30 | Fill #0

## 2017-02-14 MED FILL — DIGOXIN 0.125 MG TABLET: 125 | 30 days supply | Qty: 30 | Fill #1

## 2017-02-14 MED FILL — CARVEDILOL 6.25 MG TABLET: 6.25 | 30 days supply | Qty: 60 | Fill #1

## 2017-03-04 ENCOUNTER — Other Ambulatory Visit (HOSPITAL_COMMUNITY): Payer: Self-pay | Admitting: Student

## 2017-03-19 ENCOUNTER — Other Ambulatory Visit (HOSPITAL_COMMUNITY): Payer: Self-pay | Admitting: Pharmacist

## 2017-03-19 MED ORDER — SACUBITRIL-VALSARTAN 97-103 MG PO TABS: 1.0000 | ORAL_TABLET | Freq: Two times a day (BID) | ORAL | 3 refills | Status: DC

## 2017-03-20 MED FILL — DIGOXIN 0.125 MG TABLET: 125 | 30 days supply | Qty: 30 | Fill #2

## 2017-03-20 MED FILL — CARVEDILOL 6.25 MG TABLET: 6.25 | 30 days supply | Qty: 60 | Fill #2

## 2017-03-22 ENCOUNTER — Telehealth (HOSPITAL_COMMUNITY): Payer: Self-pay | Admitting: Pharmacist

## 2017-03-22 NOTE — Telephone Encounter (Signed)
[employer] patient assistance approved for Entresto 97-103 mg BID through 03/26/18.   [name]. Bonnye Fava, PharmD, BCPS, CPP Clinical Pharmacist Pager: [phone] Phone: [phone] 03/22/2017 10:57 AM

## 2017-04-16 ENCOUNTER — Other Ambulatory Visit (HOSPITAL_COMMUNITY): Payer: Self-pay | Admitting: Pharmacist

## 2017-04-16 MED ORDER — POTASSIUM CHLORIDE CRYS ER 20 MEQ PO TBCR: 20.0000 meq | EXTENDED_RELEASE_TABLET | Freq: Every day | ORAL | 5 refills | Status: DC

## 2017-04-18 MED FILL — DIGOXIN 0.125 MG TABLET: 125 | 30 days supply | Qty: 30 | Fill #3

## 2017-04-18 MED FILL — CARVEDILOL 6.25 MG TABLET: 6.25 | 30 days supply | Qty: 60 | Fill #3

## 2017-05-22 ENCOUNTER — Other Ambulatory Visit (HOSPITAL_COMMUNITY): Payer: Self-pay | Admitting: Pharmacist

## 2017-05-22 DIAGNOSIS — I5023 Acute on chronic systolic (congestive) heart failure: Secondary | ICD-10-CM

## 2017-05-22 MED ORDER — DIGOXIN 125 MCG PO TABS: 0.1250 mg | ORAL_TABLET | Freq: Every day | ORAL | 5 refills | Status: DC

## 2017-05-22 MED ORDER — CARVEDILOL 6.25 MG PO TABS: 6.2500 mg | ORAL_TABLET | Freq: Two times a day (BID) | ORAL | 5 refills | Status: DC

## 2017-05-22 MED FILL — DIGOXIN 0.125 MG TABLET: 125 | 30 days supply | Qty: 30 | Fill #0

## 2017-05-22 MED FILL — CARVEDILOL 6.25 MG TABLET: 6.25 | 30 days supply | Qty: 60 | Fill #0

## 2017-06-12 ENCOUNTER — Other Ambulatory Visit (HOSPITAL_COMMUNITY): Payer: Self-pay

## 2017-06-12 ENCOUNTER — Encounter (HOSPITAL_COMMUNITY): Payer: Self-pay | Admitting: Internal Medicine

## 2017-06-19 ENCOUNTER — Encounter (HOSPITAL_COMMUNITY): Payer: Self-pay

## 2017-06-19 ENCOUNTER — Ambulatory Visit (HOSPITAL_BASED_OUTPATIENT_CLINIC_OR_DEPARTMENT_OTHER)
Admission: RE | Admit: 2017-06-19 | Discharge: 2017-06-19 | Disposition: A | Discharge status: Home / Self Care | Payer: Self-pay | Source: Ambulatory Visit | Attending: Internal Medicine | Admitting: Internal Medicine

## 2017-06-19 ENCOUNTER — Ambulatory Visit (HOSPITAL_COMMUNITY)
Admission: RE | Admit: 2017-06-19 | Discharge: 2017-06-19 | Disposition: A | Discharge status: Home / Self Care | Payer: Self-pay | Source: Ambulatory Visit | Attending: Cardiology | Admitting: Cardiology

## 2017-06-19 ENCOUNTER — Encounter (HOSPITAL_COMMUNITY): Payer: Self-pay | Admitting: Internal Medicine

## 2017-06-19 VITALS — BP 128/82 | HR 57 | Wt 210.0 lb

## 2017-06-19 DIAGNOSIS — Z79899 Other long term (current) drug therapy: Secondary | ICD-10-CM | POA: Insufficient documentation

## 2017-06-19 DIAGNOSIS — Z9119 Patient's noncompliance with other medical treatment and regimen: Secondary | ICD-10-CM | POA: Insufficient documentation

## 2017-06-19 DIAGNOSIS — I429 Cardiomyopathy, unspecified: Secondary | ICD-10-CM | POA: Insufficient documentation

## 2017-06-19 DIAGNOSIS — I10 Essential (primary) hypertension: Secondary | ICD-10-CM

## 2017-06-19 DIAGNOSIS — E785 Hyperlipidemia, unspecified: Secondary | ICD-10-CM | POA: Insufficient documentation

## 2017-06-19 DIAGNOSIS — Z7982 Long term (current) use of aspirin: Secondary | ICD-10-CM | POA: Insufficient documentation

## 2017-06-19 DIAGNOSIS — F1010 Alcohol abuse, uncomplicated: Secondary | ICD-10-CM | POA: Insufficient documentation

## 2017-06-19 DIAGNOSIS — I110 Hypertensive heart disease with heart failure: Secondary | ICD-10-CM | POA: Insufficient documentation

## 2017-06-19 DIAGNOSIS — I5022 Chronic systolic (congestive) heart failure: Secondary | ICD-10-CM

## 2017-06-19 DIAGNOSIS — E119 Type 2 diabetes mellitus without complications: Secondary | ICD-10-CM | POA: Insufficient documentation

## 2017-06-19 DIAGNOSIS — Z8249 Family history of ischemic heart disease and other diseases of the circulatory system: Secondary | ICD-10-CM | POA: Insufficient documentation

## 2017-06-19 LAB — BASIC METABOLIC PANEL
ANION GAP: 9 (ref 5–15)
BUN: 16 mg/dL (ref 6–20)
CALCIUM: 9.3 mg/dL (ref 8.9–10.3)
CHLORIDE: 99 mmol/L — AB (ref 101–111)
CO2: 26 mmol/L (ref 22–32)
CREATININE: 0.96 mg/dL (ref 0.61–1.24)
GFR calc non Af Amer: >60 mL/min (ref >60)
GLUCOSE: 102 mg/dL — AB (ref 65–99)
Potassium: 3.9 mmol/L (ref 3.5–5.1)
Sodium: 134 mmol/L — ABNORMAL LOW (ref 135–145)

## 2017-06-19 MED FILL — CARVEDILOL 6.25 MG TABLET: 6.25 | 30 days supply | Qty: 60 | Fill #1

## 2017-06-19 NOTE — Progress Notes (Signed)
  Echocardiogram 2D Echocardiogram has been performed.  Brendan Rogers F 06/19/2017, 2:05 PM

## 2017-06-19 NOTE — Patient Instructions (Addendum)
Routine lab work today. Will notify you of abnormal results, otherwise no news is good news!  STOP Digoxin.  Follow up 6 months with Dr. Gala Romney. We will call you closer to this time, or you may call our office to schedule 1 month before you are due to be seen. Take all medication as prescribed the day of your appointment. Bring all medications with you to your appointment.  Do the following things EVERYDAY: 1) Weigh yourself in the morning before breakfast. Write it down and keep it in a log. 2) Take your medicines as prescribed 3) Eat low salt foods-Limit salt (sodium) to 2000 mg per day.  4) Stay as active as you can everyday 5) Limit all fluids for the day to less than 2 liters

## 2017-06-19 NOTE — Addendum Note (Signed)
Encounter addended by: Chyrl Civatte, RN on: 06/19/2017  2:52 PM<BR>    Actions taken: Medication long-term status modified, Order list changed, Diagnosis association updated, Sign clinical note

## 2017-06-19 NOTE — Progress Notes (Signed)
Patient ID: [name], male   DOB: 08-Sep-1966, 51 y.o.   MRN: [id number]    Advanced Heart Failure Clinic Note   Primary Care: Kirstie Peri, MD Primary Cardiologist: Previously Dr Antoine Poche but hasn't followed up in > 2 years Primary HF: Dr. Gala Romney   HPI:  Brendan Rogers is a 51 y.o. male with hx of NICM, HTN, HLD, and alcohol abuse.  Pt reported EF 10% in June 2006, cath with insignificant CAD. Increased to 55-60% in 2008 by echo with medical management.After he improved, he stopped all medicine except for baby ASA and fish oil.  Echo (01/04/16) LVEF 20-25% with akinesis of the anteroseptal myocardium. Mod MR. Mod LAE. Mildly dilated RV. Mod dilated RA, Mod TR, PA peak pressure 51 mm Hg.   Admitted 01/03/16 with volume overload. EF back down in setting of medical noncompliance and ETOH abuse. Meds titrated and diuresed with IV lasix 40 mg BID.  Out 6 L and down 8 lbs. Discharge weight 207 lbs on hospital scales.   He presents today for regular follow up. Feeling good overall. Working out at [employer] 3-4 times a week. Denies any SOB. Sleeps on 2 pillows, no PND. Denies peripheral edema or CP. Denies lightheadedness or dizziness. At times has trouble paying for this medication as his jobs are usually temporary. Tries to watch his fluid and salt.   CPX 01/13/16 FVC 2.85 (75%)    FEV1 2.38 (78%)     FEV1/FVC 83 (104%)     MVV 121 (85%)  Exercise Time:16:43 Speed (mph): 4.0  Grade (%): 16.5   RPE: 17 Reason stopped: Patient ended test due to overall fatigue. Additional symptoms: Dyspnea (7/10) Resting HR: 101 Peak HR: 170  (100% age predicted max HR) BP rest: 102/78 BP peak: 160/76 Peak VO2: 30.7 (104% predicted peak VO2) VE/VCO2 slope: 31 OUES: 3.10 Peak RER: 1.06 Ventilatory Threshold: 18.9  (64% predicted or measured peak VO2) Peak RR 45 Peak Ventilation: 84.8 VE/MVV: 95% PETCO2 at peak: 38 O2pulse: 17  (106% predicted O2pulse)  Past Medical History:   Diagnosis Date  . CHF (congestive heart failure) (HCC)    secondary to nonischemic cardiomyopathy, EF normal by echcardiogram  . Dyslipidemia   . History of ETOH abuse   . Hypertension    Current Outpatient Prescriptions  Medication Sig Dispense Refill  . aspirin 81 MG tablet Take 81 mg by mouth daily.    . carvedilol (COREG) 6.25 MG tablet Take 1 tablet (6.25 mg total) by mouth 2 (two) times daily with a meal. 60 tablet 5  . digoxin (LANOXIN) 0.125 MG tablet Take 1 tablet (0.125 mg total) by mouth daily. 30 tablet 5  . furosemide (LASIX) 40 MG tablet Take 1 tablet (40 mg total) by mouth daily. 90 tablet 3  . multivitamin (THERAGRAN) per tablet Take 1 tablet by mouth at bedtime.     . Omega-3 Fatty Acids (FISH OIL) 300 MG CAPS Take 1 capsule by mouth at bedtime.     . potassium chloride SA (K-DUR,KLOR-CON) 20 MEQ tablet Take 1 tablet (20 mEq total) by mouth daily. 30 tablet 5  . sacubitril-valsartan (ENTRESTO) 97-103 MG Take 1 tablet by mouth 2 (two) times daily. 180 tablet 3  . spironolactone (ALDACTONE) 25 MG tablet TAKE ONE TABLET BY MOUTH ONCE DAILY 30 tablet 5   No current facility-administered medications for this encounter.     No Known Allergies    Social History   Social History  . Marital status:  Divorced    Spouse name: N/A  . Number of children: N/A  . Years of education: N/A   Occupational History  . Not on file.   Social History Main Topics  . Smoking status: Never Smoker  . Smokeless tobacco: Never Used  . Alcohol use Yes     Comment: regularly, including 3 shots a day  . Drug use: Unknown  . Sexual activity: Not on file   Other Topics Concern  . Not on file   Social History Narrative  . No narrative on file      Family History  Problem Relation Age of Onset  . Coronary artery disease Other     Vitals:   06/19/17 1406  BP: 128/82  Pulse: (!) 57  SpO2: 98%  Weight: 210 lb (95.3 kg)   Wt Readings from Last 3 Encounters:  06/19/17 210 lb  (95.3 kg)  12/18/16 205 lb 9.6 oz (93.3 kg)  08/31/16 209 lb (94.8 kg)     PHYSICAL EXAM: General: Well appearing. No resp difficulty. Muscular HEENT: Normal Neck: Supple. JVP 6-7 cm. Carotids 2+ bilat; no bruits. No thyromegaly or nodule noted. Cor: PMI nondisplaced. RRR, No M/G/R noted. 2/6 SEM Lungs: CTAB, normal effort. Abdomen: Soft, non-tender, non-distended, no HSM. No bruits or masses. +BS  Extremities: No cyanosis, clubbing, rash, R and LLE no edema.  Neuro: Alert & orientedx3, cranial nerves grossly intact. moves all 4 extremities w/o difficulty. Affect pleasant   ASSESSMENT & PLAN:  1. 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 Echo EF 30-35% in Dec. 2017.  - Echo today personally reviewed by Dr. Gala Romney. LVEF 40-45%, improved from previous.  - NYHA I - Continue Entresto 97/103 mg BID - Continue lasix 40 mg daily.  - Continue Coreg 6.25 mg BID  - Stop digoxin.  - Continue spiro 25 mg daily. - No ICD with NYHA and improving EF 2. HTN - Blood pressure well controlled. Continue current regimen. 3. HLD: on omega 3 fatty acids. No change.  4. ETOH abuse - Encouraged total abstinence.  5. DM2 - Per primary.  6. Social - No insurance, Jobs are all temporary. Will have HFSW see for medication needs.   Arvilla Meres, MD  2:44 PM   Patient seen and examined with the above-signed Advanced Practice Provider and/or Housestaff. I personally reviewed laboratory data, imaging studies and relevant notes. I independently examined the patient and formulated the important aspects of the plan. I have edited the note to reflect any of my changes or salient points. I have personally discussed the plan with the patient and/or family.  Doing well. Echo reviewed personally EF 40-45% (improved). Volume status looks ok. Can stop digoxin. BP well controlled. Will have SW see him to help with meds.  Arvilla Meres, MD  2:45 PM

## 2017-06-20 ENCOUNTER — Telehealth: Payer: Self-pay | Admitting: Licensed Clinical Social Worker

## 2017-06-20 NOTE — Telephone Encounter (Signed)
CSW referred to assist patient with insurance/medication assistance. CSW requested to contact SO on her cell to discuss options. CSW contacted Rinaldo Cloud who requests CSW to return call on her cell after 4pm when patient is home with her. CSW will call back after 4pm. Lasandra Beech, LCSW, CCSW-MCS [phone]

## 2017-07-29 MED FILL — CARVEDILOL 6.25 MG TABLET: 6.25 | 30 days supply | Qty: 60 | Fill #2

## 2017-08-26 MED FILL — CARVEDILOL 6.25 MG TABLET: 6.25 | 30 days supply | Qty: 60 | Fill #3

## 2017-09-13 ENCOUNTER — Other Ambulatory Visit (HOSPITAL_COMMUNITY): Payer: Self-pay | Admitting: Internal Medicine

## 2017-09-13 ENCOUNTER — Other Ambulatory Visit (HOSPITAL_COMMUNITY): Payer: Self-pay | Admitting: Pharmacist

## 2017-09-13 MED ORDER — SPIRONOLACTONE 25 MG PO TABS: 25.0000 mg | ORAL_TABLET | Freq: Every day | ORAL | 5 refills | Status: DC

## 2017-10-01 MED FILL — CARVEDILOL 6.25 MG TABLET: 6.25 | 30 days supply | Qty: 60 | Fill #4

## 2017-10-29 MED FILL — CARVEDILOL 6.25 MG TABLET: 6.25 | 30 days supply | Qty: 60 | Fill #5

## 2017-11-29 ENCOUNTER — Other Ambulatory Visit (HOSPITAL_COMMUNITY): Payer: Self-pay | Admitting: Internal Medicine

## 2017-11-29 DIAGNOSIS — I5023 Acute on chronic systolic (congestive) heart failure: Secondary | ICD-10-CM

## 2017-12-02 MED FILL — CARVEDILOL 6.25 MG TABLET: 6.25 | 30 days supply | Qty: 60 | Fill #0

## 2018-01-06 MED FILL — CARVEDILOL 6.25 MG TABLET: 6.25 | 30 days supply | Qty: 60 | Fill #1

## 2018-01-14 ENCOUNTER — Other Ambulatory Visit (HOSPITAL_COMMUNITY): Payer: Self-pay | Admitting: Pharmacist

## 2018-01-14 MED ORDER — FUROSEMIDE 40 MG PO TABS: 40.0000 mg | ORAL_TABLET | Freq: Every day | ORAL | 3 refills | Status: DC

## 2018-01-21 ENCOUNTER — Telehealth (HOSPITAL_COMMUNITY): Payer: Self-pay

## 2018-01-21 NOTE — Telephone Encounter (Signed)
Called Pt b/c Entresto arrived and ready for pick-up

## 2018-01-29 NOTE — Telephone Encounter (Signed)
Attempting to contact patient to inform him entresto shipment is here.  No answer on both numbers, rx left in the front office for pick up

## 2018-02-04 MED FILL — CARVEDILOL 6.25 MG TABLET: 6.25 | 30 days supply | Qty: 60 | Fill #2

## 2018-02-10 ENCOUNTER — Telehealth (HOSPITAL_COMMUNITY): Payer: Self-pay | Admitting: Vascular Surgery

## 2018-02-10 NOTE — Telephone Encounter (Signed)
Called pt to resch appt due to DB not being in office, called pt home and cell pt does not have VM , I will resch appt and send pt letter

## 2018-02-25 ENCOUNTER — Encounter (HOSPITAL_COMMUNITY): Payer: Self-pay | Admitting: Internal Medicine

## 2018-02-25 MED FILL — CARVEDILOL 6.25 MG TABLET: 6.25 | 30 days supply | Qty: 60 | Fill #3

## 2018-02-26 ENCOUNTER — Encounter (HOSPITAL_COMMUNITY): Payer: Self-pay | Admitting: Internal Medicine

## 2018-03-05 ENCOUNTER — Encounter (HOSPITAL_COMMUNITY): Payer: Self-pay

## 2018-03-05 NOTE — Progress Notes (Signed)
[employer] Patient [employer] renewal form faxed with income verification paperwork.  Ave Filter, RN

## 2018-04-07 MED FILL — CARVEDILOL 6.25 MG TABLET: 6.25 | 30 days supply | Qty: 60 | Fill #4

## 2018-04-18 ENCOUNTER — Other Ambulatory Visit (HOSPITAL_COMMUNITY): Payer: Self-pay | Admitting: Internal Medicine

## 2018-04-29 ENCOUNTER — Ambulatory Visit (HOSPITAL_COMMUNITY)
Admission: RE | Admit: 2018-04-29 | Discharge: 2018-04-29 | Disposition: A | Discharge status: Home / Self Care | Payer: BLUE CROSS/BLUE SHIELD | Source: Ambulatory Visit | Attending: Internal Medicine | Admitting: Internal Medicine

## 2018-04-29 VITALS — BP 110/69 | HR 99 | Wt 212.4 lb

## 2018-04-29 DIAGNOSIS — I428 Other cardiomyopathies: Secondary | ICD-10-CM | POA: Insufficient documentation

## 2018-04-29 DIAGNOSIS — Z7982 Long term (current) use of aspirin: Secondary | ICD-10-CM | POA: Insufficient documentation

## 2018-04-29 DIAGNOSIS — F1010 Alcohol abuse, uncomplicated: Secondary | ICD-10-CM | POA: Diagnosis not present

## 2018-04-29 DIAGNOSIS — Z79899 Other long term (current) drug therapy: Secondary | ICD-10-CM | POA: Insufficient documentation

## 2018-04-29 DIAGNOSIS — I110 Hypertensive heart disease with heart failure: Secondary | ICD-10-CM | POA: Insufficient documentation

## 2018-04-29 DIAGNOSIS — E785 Hyperlipidemia, unspecified: Secondary | ICD-10-CM | POA: Insufficient documentation

## 2018-04-29 DIAGNOSIS — I5022 Chronic systolic (congestive) heart failure: Secondary | ICD-10-CM | POA: Insufficient documentation

## 2018-04-29 MED ORDER — FUROSEMIDE 40 MG PO TABS: 40.0000 mg | ORAL_TABLET | ORAL | 3 refills | Status: DC | PRN

## 2018-04-29 MED ORDER — POTASSIUM CHLORIDE CRYS ER 20 MEQ PO TBCR: 20.0000 meq | EXTENDED_RELEASE_TABLET | ORAL | 1 refills | Status: DC | PRN

## 2018-04-29 MED FILL — CARVEDILOL 6.25 MG TABLET: 6.25 | 30 days supply | Qty: 60 | Fill #5

## 2018-04-29 NOTE — Addendum Note (Signed)
Encounter addended by: Noralee Space, RN on: 04/29/2018 1:37 PM  Actions taken: Order list changed, Diagnosis association updated, Sign clinical note

## 2018-04-29 NOTE — Patient Instructions (Signed)
Stop Aspirin  Stop Furosemide (lasix) ONLY TAKE AS NEEDED   Stop Potassium, ONLY TAKE WHEN YOU TAKE FUROSEMIDE  Your physician has requested that you have an echocardiogram. Echocardiography is a painless test that uses sound waves to create images of your heart. It provides your doctor with information about the size and shape of your heart and how well your heart's chambers and valves are working. This procedure takes approximately one hour. There are no restrictions for this procedure.  We will contact you in 6 months to schedule your next appointment.

## 2018-04-29 NOTE — Progress Notes (Signed)
Patient ID: Brendan Rogers, male   DOB: 06/08/1966, 52 y.o.   MRN: [id number]    Advanced Heart Failure Clinic Note   Primary Care: Kirstie Peri, MD Primary Cardiologist: Previously Dr Antoine Poche but hasn't followed up in > 2 years Primary HF: Dr. Gala Romney   HPI:  Brendan Rogers is a 52 y.o. male with hx of NICM, HTN, HLD, and alcohol abuse.  Pt reported EF 10% in June 2006, cath with insignificant CAD. Increased to 55-60% in 2008 by echo with medical management.After he improved, he stopped all medicine except for baby ASA and fish oil.  Echo (01/04/16) LVEF 20-25% with akinesis of the anteroseptal myocardium. Mod MR. Mod LAE. Mildly dilated RV. Mod dilated RA, Mod TR, PA peak pressure 51 mm Hg.   Admitted 01/03/16 with volume overload. EF back down in setting of medical noncompliance and ETOH abuse. Meds titrated and diuresed with IV lasix 40 mg BID.  Out 6 L and down 8 lbs. Discharge weight 207 lbs on hospital scales.   He presents today for regular follow up. Last visit digoxin was stopped with improvement of EF to 40-45%. Overall feeling great. Working out at [employer] 2-3 times a week. Rides bicycle. He was able to get health insurance in March. Denies lightheadedness or dizziness. Denies SOB or DOE. Sleeps on 2 pillows chronically for comfort. Denies CP or peripheral edema. Trying to watch his salt and fluid. Does not yet have PCP. Having trouble working with [employer].  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 Additional symptoms: Dyspnea (7/10) Resting HR: 101 Peak HR: 170  (100% age predicted max HR) BP rest: 102/78 BP peak: 160/76 Peak VO2: 30.7 (104% predicted peak VO2) VE/VCO2 slope: 31 OUES: 3.10 Peak RER: 1.06 Ventilatory Threshold: 18.9   (64% predicted or measured peak VO2) Peak RR 45 Peak Ventilation: 84.8 VE/MVV: 95% PETCO2 at peak: 38 O2pulse: 17  (106% predicted O2pulse)  Review of systems complete and found to be negative unless listed in HPI.    Past Medical History:  Diagnosis Date  . CHF (congestive heart failure) (HCC)    secondary to nonischemic cardiomyopathy, EF normal by echcardiogram  . Dyslipidemia   . History of ETOH abuse   . Hypertension    Current Outpatient Medications  Medication Sig Dispense Refill  . aspirin 81 MG tablet Take 81 mg by mouth daily.    . carvedilol (COREG) 6.25 MG tablet TAKE 1 TABLET (6.25 MG TOTAL) BY MOUTH 2 (TWO) TIMES DAILY WITH A MEAL. 60 tablet 5  . furosemide (LASIX) 40 MG tablet Take 1 tablet (40 mg total) by mouth daily. 90 tablet 3  . multivitamin (THERAGRAN) per tablet Take 1 tablet by mouth at bedtime.     . Omega-3 Fatty Acids (FISH OIL) 300 MG CAPS Take 1 capsule by mouth at bedtime.     . potassium chloride SA (K-DUR,KLOR-CON) 20 MEQ tablet TAKE 1 TABLET BY MOUTH ONCE DAILY 30 tablet 1  . sacubitril-valsartan (ENTRESTO) 97-103 MG Take 1 tablet by mouth 2 (two) times daily. 180 tablet 3  . spironolactone (ALDACTONE) 25 MG tablet Take 1 tablet (25 mg total) by mouth daily. 30 tablet 5  . spironolactone (ALDACTONE)  25 MG tablet TAKE 1 TABLET BY MOUTH ONCE DAILY 30 tablet 3   No current facility-administered medications for this visit.    No Known Allergies  Social History   Socioeconomic History  . Marital status: Divorced    Spouse name: Not on file  . Number of children: Not on file  . Years of education: Not on file  . Highest education level: Not on file  Occupational History  . Not on file  Social Needs  . Financial resource strain: Not on file  . Food insecurity:    Worry: Not on file    Inability: Not on file  . Transportation needs:    Medical: Not on file    Non-medical: Not on file  Tobacco Use  . Smoking status: Never Smoker  .  Smokeless tobacco: Never Used  Substance and Sexual Activity  . Alcohol use: Yes    Comment: regularly, including 3 shots a day  . Drug use: Not on file  . Sexual activity: Not on file  Lifestyle  . Physical activity:    Days per week: Not on file    Minutes per session: Not on file  . Stress: Not on file  Relationships  . Social connections:    Talks on phone: Not on file    Gets together: Not on file    Attends religious service: Not on file    Active member of club or organization: Not on file    Attends meetings of clubs or organizations: Not on file    Relationship status: Not on file  . Intimate partner violence:    Fear of current or ex partner: Not on file    Emotionally abused: Not on file    Physically abused: Not on file    Forced sexual activity: Not on file  Other Topics Concern  . Not on file  Social History Narrative  . Not on file      Family History  Problem Relation Age of Onset  . Coronary artery disease Other    Vitals:   04/29/18 1243  BP: 110/69  Pulse: 99  SpO2: 99%  Weight: 96.3 kg (212 lb 6.4 oz)   Wt Readings from Last 3 Encounters:  04/29/18 96.3 kg (212 lb 6.4 oz)  06/19/17 95.3 kg (210 lb)  12/18/16 93.3 kg (205 lb 9.6 oz)    PHYSICAL EXAM: General:  Well appearing. No resp difficulty HEENT: normal Neck: supple. no JVD. Carotids 2+ bilat; no bruits. No lymphadenopathy or thryomegaly appreciated. Cor: PMI nondisplaced. Regular rate & rhythm. No rubs, gallops or murmurs. Lungs: clear Abdomen: soft, nontender, nondistended. No hepatosplenomegaly. No bruits or masses. Good bowel sounds. Extremities: no cyanosis, clubbing, rash, edema Neuro: alert & orientedx3, cranial nerves grossly intact. moves all 4 extremities w/o difficulty. Affect pleasant   ASSESSMENT & PLAN:  1. 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 Echo EF 30-35% in Dec. 2017.  - Echo 06/2017 LVEF 40-45%, improved from previous.  - NYHA I symptoms -  Volume status stable on exam.   - Continue Entresto 97/103 mg BID. BMET today.  - Continue lasix 40 mg daily.  - Continue Coreg 6.25 mg BID  - Continue spiro 25 mg daily. - No ICD with NYHA and improving EF - Reinforced fluid restriction to < 2 L daily, sodium restriction to less than 2000 mg daily, and the importance of daily weights.   2. HTN - Stable on current regimen. 3. HLD:  -  On omega 3 fatty acids. No change.  4. ETOH abuse - Encouraged complete abstinence.   Graciella Freer, PA-C  11:59 AM   Patient seen and examined with the above-signed Advanced Practice Provider and/or Housestaff. I personally reviewed laboratory data, imaging studies and relevant notes. I independently examined the patient and formulated the important aspects of the plan. I have edited the note to reflect any of my changes or salient points. I have personally discussed the plan with the patient and/or family.  Doing very well. NYHA I. Volume status ok. Long talk about whether or not he can stop some of his meds. Discussed that there is a 40% chance of EF going back down if meds stopped. Will continue Entresto, carvedilol and spiro. Can stop ASA, lasix and KCL. Take lasix only as needed. Repeat echo in 3 months.   Arvilla Meres, MD  1:28 PM

## 2018-05-01 ENCOUNTER — Other Ambulatory Visit (HOSPITAL_COMMUNITY): Payer: Self-pay | Admitting: Pharmacist

## 2018-05-01 MED ORDER — SACUBITRIL-VALSARTAN 97-103 MG PO TABS: 1.0000 | ORAL_TABLET | Freq: Two times a day (BID) | ORAL | 3 refills | Status: DC

## 2018-06-02 ENCOUNTER — Other Ambulatory Visit (HOSPITAL_COMMUNITY): Payer: Self-pay | Admitting: Internal Medicine

## 2018-06-02 DIAGNOSIS — I5023 Acute on chronic systolic (congestive) heart failure: Secondary | ICD-10-CM

## 2018-06-02 MED FILL — CARVEDILOL 6.25 MG TABLET: 6.25 | 30 days supply | Qty: 60 | Fill #0

## 2018-07-07 MED FILL — CARVEDILOL 6.25 MG TABLET: 6.25 | 30 days supply | Qty: 60 | Fill #1

## 2018-07-18 ENCOUNTER — Other Ambulatory Visit (HOSPITAL_COMMUNITY): Payer: Self-pay | Admitting: Internal Medicine

## 2018-07-21 ENCOUNTER — Other Ambulatory Visit (HOSPITAL_COMMUNITY): Payer: Self-pay | Admitting: Pharmacist

## 2018-07-29 ENCOUNTER — Ambulatory Visit (HOSPITAL_COMMUNITY)
Admission: RE | Admit: 2018-07-29 | Discharge: 2018-07-29 | Disposition: A | Discharge status: Home / Self Care | Payer: BLUE CROSS/BLUE SHIELD | Source: Ambulatory Visit | Attending: Internal Medicine | Admitting: Internal Medicine

## 2018-07-29 DIAGNOSIS — I340 Nonrheumatic mitral (valve) insufficiency: Secondary | ICD-10-CM | POA: Insufficient documentation

## 2018-07-29 DIAGNOSIS — E785 Hyperlipidemia, unspecified: Secondary | ICD-10-CM | POA: Insufficient documentation

## 2018-07-29 DIAGNOSIS — I5022 Chronic systolic (congestive) heart failure: Secondary | ICD-10-CM | POA: Diagnosis not present

## 2018-07-29 DIAGNOSIS — F1010 Alcohol abuse, uncomplicated: Secondary | ICD-10-CM | POA: Insufficient documentation

## 2018-07-29 DIAGNOSIS — I110 Hypertensive heart disease with heart failure: Secondary | ICD-10-CM | POA: Insufficient documentation

## 2018-07-29 MED FILL — CARVEDILOL 6.25 MG TABLET: 6.25 | 30 days supply | Qty: 60 | Fill #2

## 2018-07-29 NOTE — Progress Notes (Signed)
  Echocardiogram 2D Echocardiogram has been performed.  Pieter Partridge 07/29/2018, 3:47 PM

## 2018-09-08 MED FILL — CARVEDILOL 6.25 MG TABLET: 6.25 | 30 days supply | Qty: 60 | Fill #3

## 2018-12-23 ENCOUNTER — Other Ambulatory Visit (HOSPITAL_COMMUNITY): Payer: Self-pay | Admitting: Cardiology

## 2018-12-23 DIAGNOSIS — I5023 Acute on chronic systolic (congestive) heart failure: Secondary | ICD-10-CM

## 2018-12-23 MED ORDER — CARVEDILOL 6.25 MG PO TABS: 6.2500 mg | ORAL_TABLET | Freq: Two times a day (BID) | ORAL | 5 refills | Status: DC

## 2019-03-10 ENCOUNTER — Other Ambulatory Visit (HOSPITAL_COMMUNITY): Payer: Self-pay | Admitting: Internal Medicine

## 2019-03-17 ENCOUNTER — Telehealth (HOSPITAL_COMMUNITY): Payer: Self-pay | Admitting: Licensed Clinical Social Worker

## 2019-03-17 NOTE — Telephone Encounter (Signed)
Clinic received notice from [employer] that patient's assistance is expiring shortly.  CSW called pt to discuss.  Pt reports he now has insurance and had been given a copay card so he no longer needs to apply for [employer].  CSW will continue to follow through clinic and assist as needed  Jorge Ny, [facility] Desk#: [phone] Cell#: [phone]

## 2019-04-13 ENCOUNTER — Other Ambulatory Visit (HOSPITAL_COMMUNITY): Payer: Self-pay | Admitting: Internal Medicine

## 2019-04-21 ENCOUNTER — Ambulatory Visit (HOSPITAL_COMMUNITY)
Admission: RE | Admit: 2019-04-21 | Discharge: 2019-04-21 | Disposition: A | Discharge status: Home / Self Care | Payer: BC Managed Care – PPO | Source: Ambulatory Visit | Attending: Internal Medicine | Admitting: Internal Medicine

## 2019-04-21 ENCOUNTER — Other Ambulatory Visit: Payer: Self-pay

## 2019-04-21 ENCOUNTER — Encounter (HOSPITAL_COMMUNITY): Payer: Self-pay | Admitting: Internal Medicine

## 2019-04-21 VITALS — BP 128/88 | HR 95 | Wt 223.6 lb

## 2019-04-21 DIAGNOSIS — F1010 Alcohol abuse, uncomplicated: Secondary | ICD-10-CM | POA: Insufficient documentation

## 2019-04-21 DIAGNOSIS — I5023 Acute on chronic systolic (congestive) heart failure: Secondary | ICD-10-CM

## 2019-04-21 DIAGNOSIS — I428 Other cardiomyopathies: Secondary | ICD-10-CM | POA: Diagnosis not present

## 2019-04-21 DIAGNOSIS — Z79899 Other long term (current) drug therapy: Secondary | ICD-10-CM | POA: Diagnosis not present

## 2019-04-21 DIAGNOSIS — Z9119 Patient's noncompliance with other medical treatment and regimen: Secondary | ICD-10-CM | POA: Insufficient documentation

## 2019-04-21 DIAGNOSIS — I110 Hypertensive heart disease with heart failure: Secondary | ICD-10-CM | POA: Insufficient documentation

## 2019-04-21 DIAGNOSIS — I5022 Chronic systolic (congestive) heart failure: Secondary | ICD-10-CM | POA: Diagnosis not present

## 2019-04-21 DIAGNOSIS — E785 Hyperlipidemia, unspecified: Secondary | ICD-10-CM | POA: Insufficient documentation

## 2019-04-21 DIAGNOSIS — I10 Essential (primary) hypertension: Secondary | ICD-10-CM | POA: Diagnosis not present

## 2019-04-21 DIAGNOSIS — Z8249 Family history of ischemic heart disease and other diseases of the circulatory system: Secondary | ICD-10-CM | POA: Diagnosis not present

## 2019-04-21 LAB — BASIC METABOLIC PANEL
Anion gap: 11 (ref 5–15)
BUN: 20 mg/dL (ref 6–20)
CO2: 24 mmol/L (ref 22–32)
Calcium: 9.8 mg/dL (ref 8.9–10.3)
Chloride: 106 mmol/L (ref 98–111)
Creatinine, Ser: 1.13 mg/dL (ref 0.61–1.24)
GFR calc Af Amer: >60 mL/min (ref >60)
GFR calc non Af Amer: >60 mL/min (ref >60)
Glucose, Bld: 97 mg/dL (ref 70–99)
Potassium: 3.9 mmol/L (ref 3.5–5.1)
Sodium: 141 mmol/L (ref 135–145)

## 2019-04-21 MED ORDER — FUROSEMIDE 40 MG PO TABS: 20.0000 mg | ORAL_TABLET | ORAL | 3 refills | Status: DC | PRN

## 2019-04-21 MED ORDER — CARVEDILOL 6.25 MG PO TABS: 9.3750 mg | ORAL_TABLET | Freq: Two times a day (BID) | ORAL | 6 refills | Status: DC

## 2019-04-21 MED ORDER — CARVEDILOL 6.25 MG PO TABS: 9.3750 mg | ORAL_TABLET | Freq: Two times a day (BID) | ORAL | 11 refills | Status: DC

## 2019-04-21 MED ORDER — POTASSIUM CHLORIDE CRYS ER 20 MEQ PO TBCR: 20.0000 meq | EXTENDED_RELEASE_TABLET | ORAL | 3 refills | Status: DC | PRN

## 2019-04-21 MED ORDER — SPIRONOLACTONE 25 MG PO TABS: 25.0000 mg | ORAL_TABLET | Freq: Every day | ORAL | 11 refills | Status: DC

## 2019-04-21 NOTE — Patient Instructions (Signed)
INCREASE Coreg to 9.375mg  (1.5 tabs) twice a day  Labs today We will only contact you if something comes back abnormal or we need to make some changes. Otherwise no news is good news!  Your physician has requested that you have an echocardiogram. Echocardiography is a painless test that uses sound waves to create images of your heart. It provides your doctor with information about the size and shape of your heart and how well your heart's chambers and valves are working. This procedure takes approximately one hour. There are no restrictions for this procedure.   Your physician recommends that you schedule a follow-up appointment in: 3 months with an Echo  At the [facility], you and your health needs are our priority. As part of our continuing mission to provide you with exceptional heart care, we have created designated Provider Care Teams. These Care Teams include your primary Cardiologist (physician) and Advanced Practice Providers (APPs- Physician Assistants and Nurse Practitioners) who all work together to provide you with the care you need, when you need it.   You may see any of the following providers on your designated Care Team at your next follow up: Marland Kitchen Dr Glori Bickers . Dr Loralie Champagne . Darrick Grinder, NP   Please be sure to bring in all your medications bottles to every appointment.

## 2019-04-22 ENCOUNTER — Telehealth (HOSPITAL_COMMUNITY): Payer: Self-pay | Admitting: Pharmacy Technician

## 2019-04-22 NOTE — Telephone Encounter (Signed)
Received notification from [employer] that prior authorization for Entresto 97-103mg  is required.  PA submitted on CoverMyMeds Key [id number] Status is pending   Patient requested that we call [name] with approval or denial. Her phone number is [place], CPhT

## 2019-04-23 ENCOUNTER — Other Ambulatory Visit (HOSPITAL_COMMUNITY): Payer: Self-pay

## 2019-04-23 MED ORDER — ENTRESTO 97-103 MG PO TABS: 1.0000 | ORAL_TABLET | Freq: Two times a day (BID) | ORAL | 3 refills | Status: DC

## 2019-04-25 NOTE — Progress Notes (Signed)
Patient ID: Brendan Rogers, male   DOB: 08-01-1966, 53 y.o.   MRN: [id number]    Advanced Heart Failure Clinic Note   Primary Care: Kirstie Peri, MD Primary Cardiologist: Previously Dr Antoine Poche but hasn't followed up in > 2 years Primary HF: Dr. Gala Romney   HPI:  Brendan Rogers is a 53 y.o. male with hx of NICM, HTN, HLD, and alcohol abuse.  Pt reported EF 10% in June 2006, cath with insignificant CAD. Increased to 55-60% in 2008 by echo with medical management.After he improved, he stopped all medicine except for baby ASA and fish oil.  Echo (01/04/16) LVEF 20-25% with akinesis of the anteroseptal myocardium. Mod MR. Mod LAE. Mildly dilated RV. Mod dilated RA, Mod TR, PA peak pressure 51 mm Hg.   Admitted 01/03/16 with volume overload. EF back down in setting of medical noncompliance and ETOH abuse. Meds titrated and diuresed with IV lasix 40 mg BID.  Out 6 L and down 8 lbs. Discharge weight 207 lbs on hospital scales.   Echo 11/19 reads as 30-35%. I felt more like 40-45%. Digoxin stopped.   He presents today for regular follow up. Feeling very good. Remains active. Working without dififculty. Denies SOB, orthopnea, PND or edema. Taking all meds as prescribed. No dizziness.   CPX 01/13/16 FVC 2.85 (75%)    FEV1 2.38 (78%)     FEV1/FVC 83 (104%)     MVV 121 (85%)  Exercise Time:16:43 Speed (mph): 4.0  Grade (%): 16.5   RPE: 17 Reason stopped: Patient ended test due to overall fatigue. Additional symptoms: Dyspnea (7/10) Resting HR: 101 Peak HR: 170  (100% age predicted max HR) BP rest: 102/78 BP peak: 160/76 Peak VO2: 30.7 (104% predicted peak VO2) VE/VCO2 slope: 31 OUES: 3.10 Peak RER: 1.06 Ventilatory Threshold: 18.9  (64% predicted or measured peak VO2) Peak RR 45 Peak Ventilation: 84.8 VE/MVV: 95% PETCO2 at peak: 38 O2pulse: 17  (106% predicted O2pulse)  Review of systems complete and found to be negative unless listed in HPI.    Past Medical  History:  Diagnosis Date  . CHF (congestive heart failure) (HCC)    secondary to nonischemic cardiomyopathy, EF normal by echcardiogram  . Dyslipidemia   . History of ETOH abuse   . Hypertension    Current Outpatient Medications  Medication Sig Dispense Refill  . carvedilol (COREG) 6.25 MG tablet Take 1.5 tablets (9.375 mg total) by mouth 2 (two) times daily with a meal. 90 tablet 11  . furosemide (LASIX) 40 MG tablet Take 0.5 tablets (20 mg total) by mouth as needed. 45 tablet 3  . multivitamin (THERAGRAN) per tablet Take 1 tablet by mouth at bedtime.     . Omega-3 Fatty Acids (FISH OIL) 300 MG CAPS Take 1 capsule by mouth at bedtime.     Marland Kitchen spironolactone (ALDACTONE) 25 MG tablet Take 1 tablet (25 mg total) by mouth daily. 30 tablet 11  . potassium chloride SA (K-DUR) 20 MEQ tablet Take 1 tablet (20 mEq total) by mouth as needed. 90 tablet 3  . sacubitril-valsartan (ENTRESTO) 97-103 MG Take 1 tablet by mouth 2 (two) times daily. 180 tablet 3   No current facility-administered medications for this encounter.    No Known Allergies  Social History   Socioeconomic History  . Marital status: Divorced    Spouse name: Not on file  . Number of children: Not on file  . Years of education: Not on file  . Highest education level: Not on  file  Occupational History  . Not on file  Social Needs  . Financial resource strain: Not on file  . Food insecurity    Worry: Not on file    Inability: Not on file  . Transportation needs    Medical: Not on file    Non-medical: Not on file  Tobacco Use  . Smoking status: Never Smoker  . Smokeless tobacco: Never Used  Substance and Sexual Activity  . Alcohol use: Yes    Comment: regularly, including 3 shots a day  . Drug use: Not on file  . Sexual activity: Not on file  Lifestyle  . Physical activity    Days per week: Not on file    Minutes per session: Not on file  . Stress: Not on file  Relationships  . Social Herbalist on  phone: Not on file    Gets together: Not on file    Attends religious service: Not on file    Active member of club or organization: Not on file    Attends meetings of clubs or organizations: Not on file    Relationship status: Not on file  . Intimate partner violence    Fear of current or ex partner: Not on file    Emotionally abused: Not on file    Physically abused: Not on file    Forced sexual activity: Not on file  Other Topics Concern  . Not on file  Social History Narrative  . Not on file      Family History  Problem Relation Age of Onset  . Coronary artery disease Other    Vitals:   04/21/19 1357  BP: 128/88  Pulse: 95  SpO2: 99%  Weight: 101.4 kg (223 lb 9.6 oz)   Wt Readings from Last 3 Encounters:  04/21/19 101.4 kg (223 lb 9.6 oz)  04/29/18 96.3 kg (212 lb 6.4 oz)  06/19/17 95.3 kg (210 lb)    PHYSICAL EXAM: General:  Well appearing. No resp difficulty HEENT: normal Neck: supple. no JVD. Carotids 2+ bilat; no bruits. No lymphadenopathy or thryomegaly appreciated. Cor: PMI nondisplaced. Regular rate & rhythm. No rubs, gallops or murmurs. Lungs: clear Abdomen: soft, nontender, nondistended. No hepatosplenomegaly. No bruits or masses. Good bowel sounds. Extremities: no cyanosis, clubbing, rash, edema Neuro: alert & orientedx3, cranial nerves grossly intact. moves all 4 extremities w/o difficulty. Affect pleasant  ASSESSMENT & PLAN:  1. 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 Echo EF 30-35% in Dec. 2017.  - Echo 06/2017 LVEF 40-45%, improved from previous.  - Echo 11/19 LVEF read as 30-35% (I felt closer to 40-45%) - Stable NYHA I symptoms - Volume status stable on exam. Use lasix prn.  - Continue Entresto 97/103 mg BID. BMET today.  - Continue lasix 40 mg daily.  - Increase Coreg to 9.375 mg bid  - Continue spiro 25 mg daily. - No ICD with NYHA I and improving EF - Repeat echo. Labs today.  2. HTN - Blood pressure well controlled.  Continue current regimen.  3. HLD:  - On omega 3 fatty acids. No change.   4. ETOH abuse - Encouraged complete abstinence.   Glori Bickers, MD  11:08 PM

## 2019-04-28 NOTE — Telephone Encounter (Signed)
Advanced Heart Failure Patient Advocate Encounter  Prior Authorization for Delene Loll 97-103mg  has been approved.    PA# [id number] Effective dates: 04/22/2019 through 04/21/2020  Patients co-pay is $536.69.  Called [name] as requested to provide $10 Entresto co-pay that requires activation and she could not be reached. The voicemail was full and I could not leave a message. I will continue to reach out to her to make sure the patient is able to get the co-pay card information.   Charlann Boxer, CPhT

## 2019-05-05 ENCOUNTER — Other Ambulatory Visit (HOSPITAL_COMMUNITY): Payer: Self-pay

## 2019-05-05 MED ORDER — ENTRESTO 97-103 MG PO TABS: 1.0000 | ORAL_TABLET | Freq: Two times a day (BID) | ORAL | 3 refills | Status: DC

## 2019-05-05 NOTE — Telephone Encounter (Signed)
Spoke with [name] and activated co-pay card. Called [employer] in [place] as requested to make sure co-pay card went through successfully. Insurance only willing to pay for 30 days at a time.   Co-pay card information  ID: [id number] BIN: [id number] PCN: [employer]: [id number] Co-pay: $10 after card for 30-Day supply  Card will also be mailed to patient as requested.  Charlann Boxer, CPhT

## 2019-08-03 ENCOUNTER — Telehealth (HOSPITAL_COMMUNITY): Payer: Self-pay | Admitting: *Deleted

## 2019-08-03 NOTE — Telephone Encounter (Addendum)
COVID-19 pre-appointment screening questions: °  °Do you have a history of COVID-19 or a positive test result in the past 7-10 days? NO ° °To the best of your knowledge, have you been in close contact with anyone with a confirmed diagnosis of COVID 19? NO ° °Have you had any one or more of the following: Fever, chills, cough, shortness of breath (out of the normal for you) or any flu-like symptoms? NO ° °Are you experiencing any of the following symptoms that is new or out of usual for you: NO ° °• Ear, nose or throat discomfort °• Sore throat °• Headache °• Muscle Pain °• Diarrhea °• Loss of taste or smell ° ° °Reviewed all the following with patient: °• Use of hand sanitizer when entering the building °• Everyone is required to wear a mask in the building, if you do not have a mask we are happy to provide you with one when you arrive °• NO Visitor guidelines ° ° °If patient answers YES to any of questions they must change to a virtual visit and place note in comments about symptoms ° °

## 2019-08-04 ENCOUNTER — Ambulatory Visit (HOSPITAL_BASED_OUTPATIENT_CLINIC_OR_DEPARTMENT_OTHER)
Admission: RE | Admit: 2019-08-04 | Discharge: 2019-08-04 | Disposition: A | Discharge status: Home / Self Care | Payer: BC Managed Care – PPO | Source: Ambulatory Visit | Attending: Internal Medicine | Admitting: Internal Medicine

## 2019-08-04 ENCOUNTER — Ambulatory Visit (HOSPITAL_COMMUNITY)
Admission: RE | Admit: 2019-08-04 | Discharge: 2019-08-04 | Disposition: A | Discharge status: Home / Self Care | Payer: BC Managed Care – PPO | Source: Ambulatory Visit | Attending: Internal Medicine | Admitting: Internal Medicine

## 2019-08-04 ENCOUNTER — Encounter (HOSPITAL_COMMUNITY): Payer: Self-pay | Admitting: Internal Medicine

## 2019-08-04 ENCOUNTER — Other Ambulatory Visit: Payer: Self-pay

## 2019-08-04 VITALS — BP 128/79 | HR 78 | Wt 223.6 lb

## 2019-08-04 DIAGNOSIS — I5022 Chronic systolic (congestive) heart failure: Secondary | ICD-10-CM

## 2019-08-04 DIAGNOSIS — I428 Other cardiomyopathies: Secondary | ICD-10-CM | POA: Insufficient documentation

## 2019-08-04 DIAGNOSIS — I5042 Chronic combined systolic (congestive) and diastolic (congestive) heart failure: Secondary | ICD-10-CM | POA: Diagnosis not present

## 2019-08-04 DIAGNOSIS — E785 Hyperlipidemia, unspecified: Secondary | ICD-10-CM | POA: Insufficient documentation

## 2019-08-04 DIAGNOSIS — I110 Hypertensive heart disease with heart failure: Secondary | ICD-10-CM | POA: Diagnosis not present

## 2019-08-04 DIAGNOSIS — I5023 Acute on chronic systolic (congestive) heart failure: Secondary | ICD-10-CM | POA: Diagnosis not present

## 2019-08-04 DIAGNOSIS — Z9119 Patient's noncompliance with other medical treatment and regimen: Secondary | ICD-10-CM | POA: Diagnosis not present

## 2019-08-04 DIAGNOSIS — I10 Essential (primary) hypertension: Secondary | ICD-10-CM

## 2019-08-04 DIAGNOSIS — Z79899 Other long term (current) drug therapy: Secondary | ICD-10-CM | POA: Diagnosis not present

## 2019-08-04 DIAGNOSIS — Z7901 Long term (current) use of anticoagulants: Secondary | ICD-10-CM | POA: Insufficient documentation

## 2019-08-04 LAB — BASIC METABOLIC PANEL
Anion gap: 9 (ref 5–15)
BUN: 21 mg/dL — ABNORMAL HIGH (ref 6–20)
CO2: 26 mmol/L (ref 22–32)
Calcium: 9.7 mg/dL (ref 8.9–10.3)
Chloride: 102 mmol/L (ref 98–111)
Creatinine, Ser: 0.97 mg/dL (ref 0.61–1.24)
GFR calc Af Amer: >60 mL/min (ref >60)
GFR calc non Af Amer: >60 mL/min (ref >60)
Glucose, Bld: 103 mg/dL — ABNORMAL HIGH (ref 70–99)
Potassium: 4.1 mmol/L (ref 3.5–5.1)
Sodium: 137 mmol/L (ref 135–145)

## 2019-08-04 LAB — BRAIN NATRIURETIC PEPTIDE: B Natriuretic Peptide: 63.4 pg/mL (ref 0.0–100.0)

## 2019-08-04 MED ORDER — FARXIGA 10 MG PO TABS: 10.0000 mg | ORAL_TABLET | Freq: Every day | ORAL | 5 refills | Status: DC

## 2019-08-04 MED ORDER — CARVEDILOL 12.5 MG PO TABS: 12.5000 mg | ORAL_TABLET | Freq: Two times a day (BID) | ORAL | 3 refills | Status: DC

## 2019-08-04 NOTE — Patient Instructions (Signed)
Labs done today. We will contact you only if your labs are abnormal.  INCREASE Carvedilol 12.5mg (1 tab) by mouth twice daily.  START Farxiga 10mg (1 tab) by mouth once daily.  Please continue all other medications as prescribed.    Your physician recommends that you schedule a follow-up appointment in: 3-4 months  At the [facility], you and your health needs are our priority. As part of our continuing mission to provide you with exceptional heart care, we have created designated Provider Care Teams. These Care Teams include your primary Cardiologist (physician) and Advanced Practice Providers (APPs- Physician Assistants and Nurse Practitioners) who all work together to provide you with the care you need, when you need it.   You may see any of the following providers on your designated Care Team at your next follow up: Marland Kitchen Dr Glori Bickers . Dr Loralie Champagne . Darrick Grinder, NP . Lyda Jester, PA   Please be sure to bring in all your medications bottles to every appointment.

## 2019-08-04 NOTE — Progress Notes (Signed)
Patient ID: KALEP Rogers, male   DOB: April 08, 1966, 53 y.o.   MRN: [id number]    Advanced Heart Failure Clinic Note   Primary Care: Monico Blitz, MD Primary Cardiologist: Previously Dr Percival Spanish but hasn't followed up in > 2 years Primary HF: Dr. Haroldine Laws   HPI:  Brendan Rogers is a 53 y.o. male with hx of NICM, HTN, HLD, and alcohol abuse.  Pt reported EF 10% in June 2006, cath with insignificant CAD. Increased to 55-60% in 2008 by echo with medical management.After he improved, he stopped all medicine except for baby ASA and fish oil.  Echo (01/04/16) LVEF 20-25% with akinesis of the anteroseptal myocardium. Mod MR. Mod LAE. Mildly dilated RV. Mod dilated RA, Mod TR, PA peak pressure 51 mm Hg.   Admitted 01/03/16 with volume overload. EF back down in setting of medical noncompliance and ETOH abuse. Meds titrated and diuresed with IV lasix 40 mg BID.  Out 6 L and down 8 lbs. Discharge weight 207 lbs on hospital scales.   Echo 11/19 reads as 30-35%. I felt more like 40-45%. Digoxin stopped.   Echo today (08/04/19) EF 25-30%. RV normal. Personally reviewed  He presents today for regular follow up. Feels ok. 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 Denies SOB, orthopnea or PND. No edema. Taking all meds as prescribed. Drinking about 1/2 pint to 1 pint per week.   CPX 01/13/16 FVC 2.85 (75%)    FEV1 2.38 (78%)     FEV1/FVC 83 (104%)     MVV 121 (85%)  Exercise Time:16:43 Speed (mph): 4.0  Grade (%): 16.5   RPE: 17 Reason stopped: Patient ended test due to overall fatigue. Additional symptoms: Dyspnea (7/10) Resting HR: 101 Peak HR: 170  (100% age predicted max HR) BP rest: 102/78 BP peak: 160/76 Peak VO2: 30.7 (104% predicted peak VO2) VE/VCO2 slope: 31 OUES: 3.10 Peak RER: 1.06 Ventilatory Threshold: 18.9  (64% predicted or measured peak VO2) Peak RR 45 Peak Ventilation: 84.8 VE/MVV: 95% PETCO2 at peak: 38 O2pulse: 17  (106% predicted O2pulse)   Review of systems complete and found to be negative unless listed in HPI.    Past Medical History:  Diagnosis Date  . CHF (congestive heart failure) (HCC)    secondary to nonischemic cardiomyopathy, EF normal by echcardiogram  . Dyslipidemia   . History of ETOH abuse   . Hypertension    Current Outpatient Medications  Medication Sig Dispense Refill  . carvedilol (COREG) 6.25 MG tablet Take 1.5 tablets (9.375 mg total) by mouth 2 (two) times daily with a meal. 90 tablet 11  . furosemide (LASIX) 40 MG tablet Take 0.5 tablets (20 mg total) by mouth as needed. 45 tablet 3  . multivitamin (THERAGRAN) per tablet Take 1 tablet by mouth at bedtime.     . Omega-3 Fatty Acids (FISH OIL) 300 MG CAPS Take 1 capsule by mouth at bedtime.     . potassium chloride SA (K-DUR) 20 MEQ tablet Take 1 tablet (20 mEq total) by mouth as needed. 90 tablet 3  . sacubitril-valsartan (ENTRESTO) 97-103 MG Take 1 tablet by mouth 2 (two) times daily. 180 tablet 3  . spironolactone (ALDACTONE) 25 MG tablet Take 1 tablet (25 mg total) by mouth daily. 30 tablet 11   No current facility-administered medications for this encounter.    No Known Allergies  Social History   Socioeconomic History  . Marital status: Divorced    Spouse name: Not on file  .  Number of children: Not on file  . Years of education: Not on file  . Highest education level: Not on file  Occupational History  . Not on file  Social Needs  . Financial resource strain: Not on file  . Food insecurity    Worry: Not on file    Inability: Not on file  . Transportation needs    Medical: Not on file    Non-medical: Not on file  Tobacco Use  . Smoking status: Never Smoker  . Smokeless tobacco: Never Used  Substance and Sexual Activity  . Alcohol use: Yes    Comment: regularly, including 3 shots a day  . Drug use: Not on file  . Sexual activity: Not on file  Lifestyle  . Physical activity    Days per week: Not on file    Minutes per  session: Not on file  . Stress: Not on file  Relationships  . Social Musician on phone: Not on file    Gets together: Not on file    Attends religious service: Not on file    Active member of club or organization: Not on file    Attends meetings of clubs or organizations: Not on file    Relationship status: Not on file  . Intimate partner violence    Fear of current or ex partner: Not on file    Emotionally abused: Not on file    Physically abused: Not on file    Forced sexual activity: Not on file  Other Topics Concern  . Not on file  Social History Narrative  . Not on file      Family History  Problem Relation Age of Onset  . Coronary artery disease Other    Vitals:   08/04/19 1504  BP: 128/79  Pulse: 78  SpO2: 100%  Weight: 101.4 kg (223 lb 9.6 oz)   Wt Readings from Last 3 Encounters:  08/04/19 101.4 kg (223 lb 9.6 oz)  04/21/19 101.4 kg (223 lb 9.6 oz)  04/29/18 96.3 kg (212 lb 6.4 oz)    PHYSICAL EXAM: General:  Well appearing. No resp difficulty HEENT: normal Neck: supple. no JVD. Carotids 2+ bilat; no bruits. No lymphadenopathy or thryomegaly appreciated. Cor: PMI nondisplaced. Regular rate & rhythm. No rubs, gallops or murmurs. Lungs: clear Abdomen: soft, nontender, nondistended. No hepatosplenomegaly. No bruits or masses. Good bowel sounds. Extremities: no cyanosis, clubbing, rash, edema Neuro: alert & orientedx3, cranial nerves grossly intact. moves all 4 extremities w/o difficulty. Affect pleasant   ASSESSMENT & PLAN:  1. 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 Echo EF 30-35% in Dec. 2017.  - Echo 06/2017 LVEF 40-45%, improved from previous.  - Echo 11/19 LVEF read as 30-35% (I felt closer to 40-45%) - Echo today (08/04/19) EF 25-30%. RV normal. Personally reviewed - Remains with stable NYHA I symptoms despite persistent severe LV dysfunction - Volume status stable on exam. Use lasix prn.  - Continue Entresto 97/103 mg  BID. BMET today.  - Continue lasix 40 mg daily.  - Increase Coreg to 12.5 mg bid  - Continue spiro 25 mg daily. - Add Farxiga 10 daily - Consider Bidil - No ICD with persistent NYHA I  2. HTN - Blood pressure well controlled. Continue current regimen.  3. HLD:  - On omega 3 fatty acids. No change.   4. ETOH abuse - Encouraged complete abstinence.   Arvilla Meres, MD  3:16 PM

## 2019-11-23 ENCOUNTER — Encounter (HOSPITAL_COMMUNITY): Payer: Self-pay | Admitting: Internal Medicine

## 2019-11-23 ENCOUNTER — Other Ambulatory Visit: Payer: Self-pay

## 2019-11-23 ENCOUNTER — Ambulatory Visit (HOSPITAL_COMMUNITY)
Admission: RE | Admit: 2019-11-23 | Discharge: 2019-11-23 | Disposition: A | Discharge status: Home / Self Care | Payer: BC Managed Care – PPO | Source: Ambulatory Visit | Attending: Internal Medicine | Admitting: Internal Medicine

## 2019-11-23 VITALS — BP 128/74 | HR 104 | Wt 218.0 lb

## 2019-11-23 DIAGNOSIS — Z9119 Patient's noncompliance with other medical treatment and regimen: Secondary | ICD-10-CM | POA: Diagnosis not present

## 2019-11-23 DIAGNOSIS — R9431 Abnormal electrocardiogram [ECG] [EKG]: Secondary | ICD-10-CM | POA: Insufficient documentation

## 2019-11-23 DIAGNOSIS — F1010 Alcohol abuse, uncomplicated: Secondary | ICD-10-CM | POA: Insufficient documentation

## 2019-11-23 DIAGNOSIS — I5023 Acute on chronic systolic (congestive) heart failure: Secondary | ICD-10-CM

## 2019-11-23 DIAGNOSIS — Z7984 Long term (current) use of oral hypoglycemic drugs: Secondary | ICD-10-CM | POA: Insufficient documentation

## 2019-11-23 DIAGNOSIS — Z79899 Other long term (current) drug therapy: Secondary | ICD-10-CM | POA: Insufficient documentation

## 2019-11-23 DIAGNOSIS — Z8249 Family history of ischemic heart disease and other diseases of the circulatory system: Secondary | ICD-10-CM | POA: Diagnosis not present

## 2019-11-23 DIAGNOSIS — I428 Other cardiomyopathies: Secondary | ICD-10-CM | POA: Diagnosis not present

## 2019-11-23 DIAGNOSIS — I10 Essential (primary) hypertension: Secondary | ICD-10-CM

## 2019-11-23 DIAGNOSIS — I5022 Chronic systolic (congestive) heart failure: Secondary | ICD-10-CM | POA: Diagnosis not present

## 2019-11-23 DIAGNOSIS — I110 Hypertensive heart disease with heart failure: Secondary | ICD-10-CM | POA: Diagnosis not present

## 2019-11-23 DIAGNOSIS — E785 Hyperlipidemia, unspecified: Secondary | ICD-10-CM | POA: Diagnosis not present

## 2019-11-23 LAB — CBC
HCT: 41.7 % (ref 39.0–52.0)
Hemoglobin: 13.4 g/dL (ref 13.0–17.0)
MCH: 28.6 pg (ref 26.0–34.0)
MCHC: 32.1 g/dL (ref 30.0–36.0)
MCV: 89.1 fL (ref 80.0–100.0)
Platelets: 266 10*3/uL (ref 150–400)
RBC: 4.68 MIL/uL (ref 4.22–5.81)
RDW: 14.9 % (ref 11.5–15.5)
WBC: 5.8 10*3/uL (ref 4.0–10.5)
nRBC: 0 % (ref 0.0–0.2)

## 2019-11-23 LAB — BASIC METABOLIC PANEL
Anion gap: 7 (ref 5–15)
BUN: 25 mg/dL — ABNORMAL HIGH (ref 6–20)
CO2: 26 mmol/L (ref 22–32)
Calcium: 9.5 mg/dL (ref 8.9–10.3)
Chloride: 106 mmol/L (ref 98–111)
Creatinine, Ser: 1.32 mg/dL — ABNORMAL HIGH (ref 0.61–1.24)
GFR calc Af Amer: >60 mL/min (ref >60)
GFR calc non Af Amer: >60 mL/min (ref >60)
Glucose, Bld: 119 mg/dL — ABNORMAL HIGH (ref 70–99)
Potassium: 4 mmol/L (ref 3.5–5.1)
Sodium: 139 mmol/L (ref 135–145)

## 2019-11-23 LAB — BRAIN NATRIURETIC PEPTIDE: B Natriuretic Peptide: 119.4 pg/mL — ABNORMAL HIGH (ref 0.0–100.0)

## 2019-11-23 MED ORDER — CARVEDILOL 12.5 MG PO TABS: 18.7500 mg | ORAL_TABLET | Freq: Two times a day (BID) | ORAL | 3 refills | Status: DC

## 2019-11-23 NOTE — Patient Instructions (Signed)
Increase Carvedilol to 18.75 mg (1 & 1/2 tabs) Twice daily  Labs done today, we will notify you of abnormal results  Your physician has requested that you have a cardiac MRI. Cardiac MRI uses a computer to create images of your heart as its beating, producing both still and moving pictures of your heart and major blood vessels. For further information please visit [email or web address]. Please follow the instruction sheet given to you today for more information.  ONCE THIS IS APPROVED BY YOUR INSURANCE COMPANY WE WILL CONTACT YOU TO SCHEDULE.  Your physician recommends that you schedule a follow-up appointment in: 2 months  If you have any questions or concerns before your next appointment please send [place] a message through [place] or call our office at [phone].  At the [facility] Clinic, you and your health needs are our priority. As part of our continuing mission to provide you with exceptional heart care, we have created designated Provider Care Teams. These Care Teams include your primary Cardiologist (physician) and Advanced Practice Providers (APPs- Physician Assistants and Nurse Practitioners) who all work together to provide you with the care you need, when you need it.   You may see any of the following providers on your designated Care Team at your next follow up: Marland Kitchen Dr Arvilla Meres . Dr Marca Ancona . Tonye Becket, NP . Robbie Lis, PA . Karle Plumber, PharmD   Please be sure to bring in all your medications bottles to every appointment.

## 2019-11-23 NOTE — Progress Notes (Signed)
Patient ID: Brendan Rogers, male   DOB: 03-26-1966, 54 y.o.   MRN: [id number]    Advanced Heart Failure Clinic Note   Primary Care: Kirstie Peri, MD Primary Cardiologist: Previously Dr Antoine Poche but hasn't followed up in > 2 years Primary HF: Dr. Gala Romney   HPI:  Brendan Rogers is a 54 y.o. male with hx of NICM, HTN, HLD, and alcohol abuse.  Pt reported EF 10% in June 2006, cath with insignificant CAD. Increased to 55-60% in 2008 by echo with medical management.After he improved, he stopped all medicine except for baby ASA and fish oil.  Echo (01/04/16) LVEF 20-25% with akinesis of the anteroseptal myocardium. Mod MR. Mod LAE. Mildly dilated RV. Mod dilated RA, Mod TR, PA peak pressure 51 mm Hg.   Admitted 01/03/16 with volume overload. EF back down in setting of medical noncompliance and ETOH abuse. Meds titrated and diuresed with IV lasix 40 mg BID.  Out 6 L and down 8 lbs. Discharge weight 207 lbs on hospital scales.   Echo 11/19 EF read as 30-35%. I felt more like 40-45%. Digoxin stopped.   Echo 08/04/19 EF 25-30%. RV normal. Personally reviewed  He presents today for regular f/u. Feels good. Works loading trucks. Denies SOB, CP, orthopnea or PND. No edema. Complaint with meds. Drinking about 3 shots of vodka per day. (cut back) Taking lasix 20 every night.   CPX 01/13/16 FVC 2.85 (75%)    FEV1 2.38 (78%)     FEV1/FVC 83 (104%)     MVV 121 (85%)  Exercise Time:16:43 Speed (mph): 4.0  Grade (%): 16.5   RPE: 17 Reason stopped: Patient ended test due to overall fatigue. Additional symptoms: Dyspnea (7/10) Resting HR: 101 Peak HR: 170  (100% age predicted max HR) BP rest: 102/78 BP peak: 160/76 Peak VO2: 30.7 (104% predicted peak VO2) VE/VCO2 slope: 31 OUES: 3.10 Peak RER: 1.06 Ventilatory Threshold: 18.9  (64% predicted or measured peak VO2) Peak RR 45 Peak Ventilation: 84.8 VE/MVV: 95% PETCO2 at peak: 38 O2pulse: 17  (106% predicted O2pulse)  Review  of systems complete and found to be negative unless listed in HPI.    Past Medical History:  Diagnosis Date  . CHF (congestive heart failure) (HCC)    secondary to nonischemic cardiomyopathy, EF normal by echcardiogram  . Dyslipidemia   . History of ETOH abuse   . Hypertension    Current Outpatient Medications  Medication Sig Dispense Refill  . carvedilol (COREG) 12.5 MG tablet Take 1 tablet (12.5 mg total) by mouth 2 (two) times daily with a meal. 90 tablet 3  . dapagliflozin propanediol (FARXIGA) 10 MG TABS tablet Take 10 mg by mouth daily before breakfast. 30 tablet 5  . furosemide (LASIX) 40 MG tablet Take 0.5 tablets (20 mg total) by mouth as needed. 45 tablet 3  . multivitamin (THERAGRAN) per tablet Take 1 tablet by mouth at bedtime.     . Omega-3 Fatty Acids (FISH OIL) 300 MG CAPS Take 1 capsule by mouth at bedtime.     . potassium chloride SA (K-DUR) 20 MEQ tablet Take 1 tablet (20 mEq total) by mouth as needed. 90 tablet 3  . sacubitril-valsartan (ENTRESTO) 97-103 MG Take 1 tablet by mouth 2 (two) times daily. 180 tablet 3  . spironolactone (ALDACTONE) 25 MG tablet Take 1 tablet (25 mg total) by mouth daily. 30 tablet 11   No current facility-administered medications for this encounter.   No Known Allergies  Social History  Socioeconomic History  . Marital status: Divorced    Spouse name: Not on file  . Number of children: Not on file  . Years of education: Not on file  . Highest education level: Not on file  Occupational History  . Not on file  Tobacco Use  . Smoking status: Never Smoker  . Smokeless tobacco: Never Used  Substance and Sexual Activity  . Alcohol use: Yes    Comment: regularly, including 3 shots a day  . Drug use: Not on file  . Sexual activity: Not on file  Other Topics Concern  . Not on file  Social History Narrative  . Not on file   Social Determinants of Health   Financial Resource Strain:   . Difficulty of Paying Living Expenses:     Food Insecurity:   . Worried About Charity fundraiser in the Last Year:   . Arboriculturist in the Last Year:   Transportation Needs:   . Film/video editor (Medical):   Marland Kitchen Lack of Transportation (Non-Medical):   Physical Activity:   . Days of Exercise per Week:   . Minutes of Exercise per Session:   Stress:   . Feeling of Stress :   Social Connections:   . Frequency of Communication with Friends and Family:   . Frequency of Social Gatherings with Friends and Family:   . Attends Religious Services:   . Active Member of Clubs or Organizations:   . Attends Archivist Meetings:   Marland Kitchen Marital Status:   Intimate Partner Violence:   . Fear of Current or Ex-Partner:   . Emotionally Abused:   Marland Kitchen Physically Abused:   . Sexually Abused:       Family History  Problem Relation Age of Onset  . Coronary artery disease Other    Vitals:   11/23/19 1518  BP: 128/74  Pulse: (!) 104  SpO2: 97%  Weight: 98.9 kg (218 lb)   Wt Readings from Last 3 Encounters:  11/23/19 98.9 kg (218 lb)  08/04/19 101.4 kg (223 lb 9.6 oz)  04/21/19 101.4 kg (223 lb 9.6 oz)    PHYSICAL EXAM: General:  Well appearing. No resp difficulty HEENT: normal Neck: supple. no JVD. Carotids 2+ bilat; no bruits. No lymphadenopathy or thryomegaly appreciated. Cor: PMI nondisplaced. Regular mildly tachyNo rubs, gallops or murmurs. Lungs: clear Abdomen: soft, nontender, nondistended. No hepatosplenomegaly. No bruits or masses. Good bowel sounds. Extremities: no cyanosis, clubbing, rash, edema Neuro: alert & orientedx3, cranial nerves grossly intact. moves all 4 extremities w/o difficulty. Affect pleasant  ECG: NSR 99 LVH with IVCD Personally reviewed   ASSESSMENT & PLAN:  1. 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 Echo EF 30-35% in Dec. 2017.  - Echo 06/2017 LVEF 40-45%, improved from previous.  - Echo 11/19 LVEF read as 30-35% (I felt closer to 40-45%) - Echo 11/24/2) EF 25-30%. RV  normal. Personally reviewed - Remains with stable NYHA I symptoms despite persistent severe LV dysfunction but he is quite tachycardic - Volume status stable on exam. Continue lasix 20 daily  - Continue Entresto 97/103 mg BID.  - Increase carvedilol to 18.75 bid  mg bid  - Continue spiro 25 mg daily. - Continue Farxiga 10 daily - Consider Bidil at next visit  - Etiology unclear. ? Related to ETOH. Will check cMRI. Described NYHA I symptoms so no ICD. But with sinus tachycardia I am worried he may be worse. May need repeat CPX  to objectively measure.    2. HTN - Blood pressure well controlled.   3. HLD:  - On omega 3 fatty acids. No change.   4. ETOH abuse - Encouraged complete abstinence.   Arvilla Meres, MD  3:37 PM

## 2020-01-12 ENCOUNTER — Ambulatory Visit (HOSPITAL_COMMUNITY)
Admission: RE | Admit: 2020-01-12 | Discharge: 2020-01-12 | Disposition: A | Discharge status: Home / Self Care | Payer: BC Managed Care – PPO | Source: Ambulatory Visit | Attending: Internal Medicine | Admitting: Internal Medicine

## 2020-01-25 ENCOUNTER — Encounter (HOSPITAL_COMMUNITY): Payer: BC Managed Care – PPO | Admitting: Internal Medicine

## 2020-02-07 ENCOUNTER — Other Ambulatory Visit (HOSPITAL_COMMUNITY): Payer: Self-pay | Admitting: Internal Medicine

## 2020-03-02 ENCOUNTER — Other Ambulatory Visit: Payer: Self-pay

## 2020-03-02 ENCOUNTER — Ambulatory Visit (INDEPENDENT_AMBULATORY_CARE_PROVIDER_SITE_OTHER): Payer: BC Managed Care – PPO

## 2020-03-02 ENCOUNTER — Ambulatory Visit
Admission: EM | Admit: 2020-03-02 | Discharge: 2020-03-02 | Disposition: A | Discharge status: Home / Self Care | Payer: BC Managed Care – PPO | Attending: Emergency Medicine | Admitting: Emergency Medicine

## 2020-03-02 DIAGNOSIS — M25551 Pain in right hip: Secondary | ICD-10-CM

## 2020-03-02 DIAGNOSIS — M1611 Unilateral primary osteoarthritis, right hip: Secondary | ICD-10-CM | POA: Diagnosis not present

## 2020-03-02 DIAGNOSIS — R2689 Other abnormalities of gait and mobility: Secondary | ICD-10-CM

## 2020-03-02 MED ORDER — IBUPROFEN 800 MG PO TABS: 800.0000 mg | ORAL_TABLET | Freq: Three times a day (TID) | ORAL | 0 refills | Status: DC

## 2020-03-02 NOTE — ED Provider Notes (Signed)
[facility]   [id number] 03/02/20 Arrival Time: 1829   Chief Complaint  Patient presents with   Hip Pain     SUBJECTIVE: History from: patient.  Brendan Rogers is a 54 y.o. male who presented to the urgent care with a complaint of right hip pain for the past few days.  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  He localizes the pain to the RT hip.  He describes the pain as constant and achy.  He has tried OTC medications without relief.  His symptoms are made worse with ROM.  He denies similar symptoms in the past.  Denies chills, fever, nausea, vomiting.   ROS: As per HPI.  All other pertinent ROS negative.      Past Medical History:  Diagnosis Date   CHF (congestive heart failure) (HCC)    secondary to nonischemic cardiomyopathy, EF normal by echcardiogram   Dyslipidemia    History of ETOH abuse    Hypertension    History reviewed. No pertinent surgical history. No Known Allergies No current facility-administered medications on file prior to encounter.   Current Outpatient Medications on File Prior to Encounter  Medication Sig Dispense Refill   carvedilol (COREG) 12.5 MG tablet Take 1.5 tablets (18.75 mg total) by mouth 2 (two) times daily with a meal. 90 tablet 3   FARXIGA 10 MG TABS tablet TAKE 1 TABLET BY MOUTH ONCE DAILY BEFORE BREAKFAST 90 tablet 3   furosemide (LASIX) 40 MG tablet Take 0.5 tablets (20 mg total) by mouth as needed. 45 tablet 3   multivitamin (THERAGRAN) per tablet Take 1 tablet by mouth at bedtime.      Omega-3 Fatty Acids (FISH OIL) 300 MG CAPS Take 1 capsule by mouth at bedtime.      potassium chloride SA (K-DUR) 20 MEQ tablet Take 1 tablet (20 mEq total) by mouth as needed. 90 tablet 3   sacubitril-valsartan (ENTRESTO) 97-103 MG Take 1 tablet by mouth 2 (two) times daily. 180 tablet 3   spironolactone (ALDACTONE) 25 MG tablet Take 1 tablet (25 mg total) by mouth daily. 30 tablet 11   Social  History   Socioeconomic History   Marital status: Divorced    Spouse name: Not on file   Number of children: Not on file   Years of education: Not on file   Highest education level: Not on file  Occupational History   Not on file  Tobacco Use   Smoking status: Never Smoker   Smokeless tobacco: Never Used  Substance and Sexual Activity   Alcohol use: Yes    Comment: regularly, including 3 shots a day   Drug use: Not on file   Sexual activity: Not on file  Other Topics Concern   Not on file  Social History Narrative   Not on file   Social Determinants of Health   Financial Resource Strain:    Difficulty of Paying Living Expenses:   Food Insecurity:    Worried About Programme researcher, broadcasting/film/video in the Last Year:    Barista in the Last Year:   Transportation Needs:    Freight forwarder (Medical):    Lack of Transportation (Non-Medical):   Physical Activity:    Days of Exercise per Week:    Minutes of Exercise per Session:   Stress:    Feeling of Stress :   Social Connections:    Frequency of Communication with Friends and Family:  Frequency of Social Gatherings with Friends and Family:    Attends Religious Services:    Active Member of Clubs or Organizations:    Attends Music therapist:    Marital Status:   Intimate Partner Violence:    Fear of Current or Ex-Partner:    Emotionally Abused:    Physically Abused:    Sexually Abused:    Family History  Problem Relation Age of Onset   Coronary artery disease Other     OBJECTIVE:  Vitals:   03/02/20 0834  BP: 116/78  Pulse: 80  Resp: 18  Temp: 98.1 F (36.7 C)  SpO2: 96%     Physical Exam Vitals and nursing note reviewed.  Constitutional:      General: He is not in acute distress.    Appearance: Normal appearance. He is normal weight. He is not ill-appearing, toxic-appearing or diaphoretic.  Cardiovascular:     Rate and Rhythm: Normal rate and regular  rhythm.     Pulses: Normal pulses.     Heart sounds: Normal heart sounds. No murmur heard.  No friction rub. No gallop.   Pulmonary:     Effort: Pulmonary effort is normal. No respiratory distress.     Breath sounds: Normal breath sounds. No stridor. No wheezing, rhonchi or rales.  Chest:     Chest wall: No tenderness.  Musculoskeletal:     Right hip: Tenderness present.     Left hip: Normal.     Comments: Pt is able to ambulate  With pain. There is no surface trauma, ecchymosis, , erythema or warmth present. Limited ROM due to pain Special Tests: Negative Straight leg raise  Neurological:     General: No focal deficit present.     Mental Status: He is alert and oriented to person, place, and time.     Cranial Nerves: No cranial nerve deficit.     Sensory: No sensory deficit.     Motor: No weakness.     Coordination: Coordination normal.     Gait: Gait normal.     Deep Tendon Reflexes: Reflexes normal.     LABS:  No results found for this or any previous visit (from the past 24 hour(s)).   ASSESSMENT & PLAN:  1. Right hip pain    Patient is stable at discharge.  Right hip pain X-ray is negative for bony abnormality including fracture or dislocation.  I have reviewed the x-ray myself and the radiologist interpretation.  I am in agreement with the radiologist interpretation.  Symptom is likely from arthritis. Was advised to take OTC ibuprofen as needed for pain.  Meds ordered this encounter  Medications   ibuprofen (ADVIL) 800 MG tablet    Sig: Take 1 tablet (800 mg total) by mouth 3 (three) times daily. Take with food    Dispense:  30 tablet    Refill:  0    Discharge Instructions Rest, ice and heat as needed Ensure adequate ROM as tolerated. Prescribed ibuprofen as needed for  pain relief Follow up with PCP Return here or go to ER if you have any new or worsening symptoms   Reviewed expectations re: course of current medical issues. Questions  answered. Outlined signs and symptoms indicating need for more acute intervention. Patient verbalized understanding. After Visit Summary given.     Note: This document was prepared using Dragon voice recognition software and may include unintentional dictation errors.     Emerson Monte, [address] 03/02/20 [phone]

## 2020-03-02 NOTE — ED Triage Notes (Signed)
Pt presents with c/o right hip pain that began on Friday. 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 Pt describes sharp pain in right hip

## 2020-03-02 NOTE — Discharge Instructions (Addendum)
Rest, ice and heat as needed Ensure adequate ROM as tolerated. Prescribed ibuprofen as needed for  pain relief Follow up with PCP Return here or go to ER if you have any new or worsening symptoms

## 2020-03-23 ENCOUNTER — Telehealth (HOSPITAL_COMMUNITY): Payer: Self-pay | Admitting: Pharmacist

## 2020-03-23 NOTE — Telephone Encounter (Signed)
Patient Advocate Encounter   Received notification from [employer] that prior authorization for Sherryll Burger is required. Attempted to submit PA through cover my meds but was informed authorization was already on file for this request.   Karle Plumber, PharmD, BCPS, BCCP, CPP Heart Failure Clinic Pharmacist [phone]

## 2020-04-04 ENCOUNTER — Telehealth (HOSPITAL_COMMUNITY): Payer: Self-pay | Admitting: *Deleted

## 2020-04-04 NOTE — Telephone Encounter (Signed)
Attempted to call patient regarding upcoming cardiac MRI appointment. Unable to leave voicemail on phone.  Will attempt again.  Burley Saver RN Navigator Cardiac Imaging [facility] [phone] Office [phone] Cell

## 2020-04-04 NOTE — Telephone Encounter (Signed)
Reaching out to patient to offer assistance regarding upcoming cardiac imaging study; pt's significant other verbalizes understanding of appt date/time, parking situation and where to check in; name and call back number provided for further questions should they arise  Burley Saver RN Navigator Cardiac [place] and [facility] [phone] office [phone] cell

## 2020-04-05 ENCOUNTER — Ambulatory Visit (HOSPITAL_COMMUNITY)
Admission: RE | Admit: 2020-04-05 | Discharge: 2020-04-05 | Disposition: A | Discharge status: Home / Self Care | Payer: BC Managed Care – PPO | Source: Ambulatory Visit | Attending: Internal Medicine | Admitting: Internal Medicine

## 2020-04-05 ENCOUNTER — Other Ambulatory Visit: Payer: Self-pay

## 2020-04-05 DIAGNOSIS — I5022 Chronic systolic (congestive) heart failure: Secondary | ICD-10-CM | POA: Insufficient documentation

## 2020-04-05 MED ORDER — GADOBUTROL 1 MMOL/ML IV SOLN
10.0000 mL | Freq: Once | INTRAVENOUS | Status: AC | PRN
  Administered 2020-04-05: 10 mL via INTRAVENOUS

## 2020-05-03 ENCOUNTER — Other Ambulatory Visit: Payer: Self-pay

## 2020-05-03 ENCOUNTER — Ambulatory Visit (HOSPITAL_COMMUNITY)
Admission: RE | Admit: 2020-05-03 | Discharge: 2020-05-03 | Disposition: A | Discharge status: Home / Self Care | Payer: BC Managed Care – PPO | Source: Ambulatory Visit | Attending: Internal Medicine | Admitting: Internal Medicine

## 2020-05-03 VITALS — BP 110/70 | HR 82 | Wt 213.4 lb

## 2020-05-03 DIAGNOSIS — Z79899 Other long term (current) drug therapy: Secondary | ICD-10-CM | POA: Diagnosis not present

## 2020-05-03 DIAGNOSIS — I110 Hypertensive heart disease with heart failure: Secondary | ICD-10-CM | POA: Insufficient documentation

## 2020-05-03 DIAGNOSIS — Z791 Long term (current) use of non-steroidal anti-inflammatories (NSAID): Secondary | ICD-10-CM | POA: Insufficient documentation

## 2020-05-03 DIAGNOSIS — I5023 Acute on chronic systolic (congestive) heart failure: Secondary | ICD-10-CM | POA: Diagnosis not present

## 2020-05-03 DIAGNOSIS — F1010 Alcohol abuse, uncomplicated: Secondary | ICD-10-CM

## 2020-05-03 DIAGNOSIS — E785 Hyperlipidemia, unspecified: Secondary | ICD-10-CM | POA: Diagnosis not present

## 2020-05-03 DIAGNOSIS — I428 Other cardiomyopathies: Secondary | ICD-10-CM | POA: Diagnosis not present

## 2020-05-03 DIAGNOSIS — Z9119 Patient's noncompliance with other medical treatment and regimen: Secondary | ICD-10-CM | POA: Diagnosis not present

## 2020-05-03 DIAGNOSIS — I10 Essential (primary) hypertension: Secondary | ICD-10-CM | POA: Diagnosis not present

## 2020-05-03 DIAGNOSIS — I2510 Atherosclerotic heart disease of native coronary artery without angina pectoris: Secondary | ICD-10-CM | POA: Insufficient documentation

## 2020-05-03 DIAGNOSIS — I5022 Chronic systolic (congestive) heart failure: Secondary | ICD-10-CM | POA: Insufficient documentation

## 2020-05-03 DIAGNOSIS — Z7984 Long term (current) use of oral hypoglycemic drugs: Secondary | ICD-10-CM | POA: Diagnosis not present

## 2020-05-03 DIAGNOSIS — Z8249 Family history of ischemic heart disease and other diseases of the circulatory system: Secondary | ICD-10-CM | POA: Insufficient documentation

## 2020-05-03 LAB — BASIC METABOLIC PANEL
Anion gap: 9 (ref 5–15)
BUN: 21 mg/dL — ABNORMAL HIGH (ref 6–20)
CO2: 25 mmol/L (ref 22–32)
Calcium: 9.7 mg/dL (ref 8.9–10.3)
Chloride: 106 mmol/L (ref 98–111)
Creatinine, Ser: 1.05 mg/dL (ref 0.61–1.24)
GFR calc Af Amer: >60 mL/min (ref >60)
GFR calc non Af Amer: >60 mL/min (ref >60)
Glucose, Bld: 100 mg/dL — ABNORMAL HIGH (ref 70–99)
Potassium: 3.8 mmol/L (ref 3.5–5.1)
Sodium: 140 mmol/L (ref 135–145)

## 2020-05-03 LAB — BRAIN NATRIURETIC PEPTIDE: B Natriuretic Peptide: 115.8 pg/mL — ABNORMAL HIGH (ref 0.0–100.0)

## 2020-05-03 MED ORDER — CARVEDILOL 25 MG PO TABS: 25.0000 mg | ORAL_TABLET | Freq: Two times a day (BID) | ORAL | 6 refills | Status: DC

## 2020-05-03 NOTE — Addendum Note (Signed)
Encounter addended by: Samara Snide, RN on: 05/03/2020 4:07 PM  Actions taken: Order list changed, Order Reconciliation Section accessed, Pharmacy for encounter modified

## 2020-05-03 NOTE — Addendum Note (Signed)
Encounter addended by: Samara Snide, RN on: 05/03/2020 3:56 PM  Actions taken: Diagnosis association updated, Pharmacy for encounter modified, Order list changed, Charge Capture section accepted, Clinical Note Signed

## 2020-05-03 NOTE — Progress Notes (Signed)
Patient ID: Brendan Rogers, male   DOB: 03/11/1966, 54 y.o.   MRN: [id number]    Advanced Heart Failure Clinic Note   Primary Care: Kirstie Peri, MD Primary Cardiologist: Previously Dr Antoine Poche but hasn't followed up in > 2 years Primary HF: Dr. Gala Romney   HPI:  Brendan Rogers is a 54 y.o. male with hx of NICM, HTN, HLD, and alcohol abuse.  Pt reported EF 10% in June 2006, cath with insignificant CAD. Increased to 55-60% in 2008 by echo with medical management.After he improved, he stopped all medicine except for baby ASA and fish oil.  Echo (01/04/16) LVEF 20-25% with akinesis of the anteroseptal myocardium. Mod MR. Mod LAE. Mildly dilated RV. Mod dilated RA, Mod TR, PA peak pressure 51 mm Hg.   Admitted 01/03/16 with volume overload. EF back down in setting of medical noncompliance and ETOH abuse. Meds titrated and diuresed with IV lasix 40 mg BID.  Out 6 L and down 8 lbs. Discharge weight 207 lbs on hospital scales.   Echo 11/19 EF read as 30-35%. I felt more like 40-45%. Digoxin stopped.   Echo 08/04/19 EF 25-30%. RV normal. Personally reviewed  cMRI 7/21 1. Moderately dilated LV with diffuse hypokinesis worse in the septum, EF 25%. 2. Normal RV size with mild to moderately decreased systolic function, EF 35%. 3. There was not extensive LGE on delayed enhancement imaging. However, there was a small area of mid-wall LGE in the apical inferior wall. This is not a coronary disease pattern, possible evidence for prior myocarditis.  He presents for f/u. 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 No CP or SOB. No edema, orthopnea or PND. Taking meds as prescribed. Still drinking about 3 large shots of vodka per day.   Studies:  CPX 01/13/16 FVC 2.85 (75%)    FEV1 2.38 (78%)     FEV1/FVC 83 (104%)     MVV 121 (85%)  Exercise Time:16:43 Speed (mph): 4.0  Grade (%): 16.5   RPE: 17 Reason stopped: Patient ended test due to overall  fatigue. Additional symptoms: Dyspnea (7/10) Resting HR: 101 Peak HR: 170  (100% age predicted max HR) BP rest: 102/78 BP peak: 160/76 Peak VO2: 30.7 (104% predicted peak VO2) VE/VCO2 slope: 31 OUES: 3.10 Peak RER: 1.06 Ventilatory Threshold: 18.9  (64% predicted or measured peak VO2) Peak RR 45 Peak Ventilation: 84.8 VE/MVV: 95% PETCO2 at peak: 38 O2pulse: 17  (106% predicted O2pulse)  Review of systems complete and found to be negative unless listed in HPI.    Past Medical History:  Diagnosis Date  . CHF (congestive heart failure) (HCC)    secondary to nonischemic cardiomyopathy, EF normal by echcardiogram  . Dyslipidemia   . History of ETOH abuse   . Hypertension    Current Outpatient Medications  Medication Sig Dispense Refill  . carvedilol (COREG) 12.5 MG tablet Take 1.5 tablets (18.75 mg total) by mouth 2 (two) times daily with a meal. 90 tablet 3  . FARXIGA 10 MG TABS tablet TAKE 1 TABLET BY MOUTH ONCE DAILY BEFORE BREAKFAST 90 tablet 3  . furosemide (LASIX) 40 MG tablet Take 0.5 tablets (20 mg total) by mouth as needed. 45 tablet 3  . ibuprofen (ADVIL) 800 MG tablet Take 1 tablet (800 mg total) by mouth 3 (three) times daily. Take with food 30 tablet 0  . multivitamin (THERAGRAN) per tablet Take 1 tablet by mouth at bedtime.     . Omega-3 Fatty Acids (FISH OIL) 300  MG CAPS Take 1 capsule by mouth at bedtime.     . potassium chloride SA (K-DUR) 20 MEQ tablet Take 1 tablet (20 mEq total) by mouth as needed. 90 tablet 3  . sacubitril-valsartan (ENTRESTO) 97-103 MG Take 1 tablet by mouth 2 (two) times daily. 180 tablet 3  . spironolactone (ALDACTONE) 25 MG tablet Take 1 tablet (25 mg total) by mouth daily. 30 tablet 11   No current facility-administered medications for this encounter.   No Known Allergies  Social History   Socioeconomic History  . Marital status: Divorced    Spouse name: Not on file  . Number of children: Not on file  . Years of education:  Not on file  . Highest education level: Not on file  Occupational History  . Not on file  Tobacco Use  . Smoking status: Never Smoker  . Smokeless tobacco: Never Used  Substance and Sexual Activity  . Alcohol use: Yes    Comment: regularly, including 3 shots a day  . Drug use: Not on file  . Sexual activity: Not on file  Other Topics Concern  . Not on file  Social History Narrative  . Not on file   Social Determinants of Health   Financial Resource Strain:   . Difficulty of Paying Living Expenses: Not on file  Food Insecurity:   . Worried About Programme researcher, broadcasting/film/video in the Last Year: Not on file  . Ran Out of Food in the Last Year: Not on file  Transportation Needs:   . Lack of Transportation (Medical): Not on file  . Lack of Transportation (Non-Medical): Not on file  Physical Activity:   . Days of Exercise per Week: Not on file  . Minutes of Exercise per Session: Not on file  Stress:   . Feeling of Stress : Not on file  Social Connections:   . Frequency of Communication with Friends and Family: Not on file  . Frequency of Social Gatherings with Friends and Family: Not on file  . Attends Religious Services: Not on file  . Active Member of Clubs or Organizations: Not on file  . Attends Banker Meetings: Not on file  . Marital Status: Not on file  Intimate Partner Violence:   . Fear of Current or Ex-Partner: Not on file  . Emotionally Abused: Not on file  . Physically Abused: Not on file  . Sexually Abused: Not on file      Family History  Problem Relation Age of Onset  . Coronary artery disease Other    Vitals:   05/03/20 1522  BP: 110/70  Pulse: 82  SpO2: 96%  Weight: 96.8 kg (213 lb 6.4 oz)   Wt Readings from Last 3 Encounters:  05/03/20 96.8 kg (213 lb 6.4 oz)  11/23/19 98.9 kg (218 lb)  08/04/19 101.4 kg (223 lb 9.6 oz)    PHYSICAL EXAM: General:  Well appearing. No resp difficulty HEENT: normal Neck: supple. no JVD. Carotids 2+ bilat;  no bruits. No lymphadenopathy or thryomegaly appreciated. Cor: PMI nondisplaced. Regular mildly tachyNo rubs, gallops or murmurs. Lungs: clear Abdomen: soft, nontender, nondistended. No hepatosplenomegaly. No bruits or masses. Good bowel sounds. Extremities: no cyanosis, clubbing, rash, edema Neuro: alert & orientedx3, cranial nerves grossly intact. moves all 4 extremities w/o difficulty. Affect pleasant  ECG: NSR 99 LVH with IVCD Personally reviewed   ASSESSMENT & PLAN:  1. 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 Echo EF 30-35% in Dec. 2017.  -  Echo 06/2017 LVEF 40-45%, improved from previous.  - Echo 11/19 LVEF read as 30-35% (I felt closer to 40-45%) - Echo 11/24/2) EF 25-30%. RV normal. Personally reviewed - cMRI 7/21 EF 25% mild LGE ? Previous myocarditis - Remains with stable NYHA I symptoms despite persistent severe LV dysfunction - Volume status stable on exam  Continue lasix 20 daily  - Continue Entresto 97/103 mg BID.  - Increase carvedilol to 25 mg bid - Continue spiro 25 mg daily. - Continue Farxiga 10 daily - Consider Bidil down the road if BP will toelrateed - NYHA I symptoms so no ICD.  - Discussed need to stop drinking ETOH as it may be causing his cardiomyopathy  2. HTN - Blood pressure well controlled.  3. HLD:  - On omega 3 fatty acids. No change.   4. ETOH abuse - Encouraged complete abstinence. As above.  Arvilla Meres, MD  3:32 PM

## 2020-05-03 NOTE — Patient Instructions (Signed)
INCREASE Carvedilol 25mg  (1 tablet) Twice daily  Labs done today, your results will be available in MyChart, we will contact you for abnormal readings.  Call our office in February to schedule your follow up appointment.  If you have any questions or concerns before your next appointment please send March a message through [place] or call our office at [phone].    TO LEAVE A MESSAGE FOR THE NURSE SELECT OPTION 2, PLEASE LEAVE A MESSAGE INCLUDING: . YOUR NAME . DATE OF BIRTH . CALL BACK NUMBER . REASON FOR CALL**this is important as we prioritize the call backs  YOU WILL RECEIVE A CALL BACK THE SAME DAY AS LONG AS YOU CALL BEFORE 4:00 PM   At the [facility], you and your health needs are our priority. As part of our continuing mission to provide you with exceptional heart care, we have created designated Provider Care Teams. These Care Teams include your primary Cardiologist (physician) and Advanced Practice Providers (APPs- Physician Assistants and Nurse Practitioners) who all work together to provide you with the care you need, when you need it.   You may see any of the following providers on your designated Care Team at your next follow up: [phone] Dr Marland Kitchen . Dr Arvilla Meres . Marca Ancona, NP . Tonye Becket, PA . Robbie Lis, PharmD   Please be sure to bring in all your medications bottles to every appointment.

## 2020-05-13 ENCOUNTER — Other Ambulatory Visit (HOSPITAL_COMMUNITY): Payer: Self-pay | Admitting: Internal Medicine

## 2020-07-23 ENCOUNTER — Other Ambulatory Visit (HOSPITAL_COMMUNITY): Payer: Self-pay | Admitting: Internal Medicine

## 2020-07-27 ENCOUNTER — Other Ambulatory Visit (HOSPITAL_COMMUNITY): Payer: Self-pay

## 2020-07-27 MED ORDER — FUROSEMIDE 40 MG PO TABS: ORAL_TABLET | ORAL | 3 refills | Status: DC

## 2020-12-05 ENCOUNTER — Encounter (HOSPITAL_COMMUNITY): Payer: Self-pay | Admitting: Internal Medicine

## 2020-12-05 ENCOUNTER — Ambulatory Visit (HOSPITAL_COMMUNITY)
Admission: RE | Admit: 2020-12-05 | Discharge: 2020-12-05 | Disposition: A | Discharge status: Home / Self Care | Payer: BC Managed Care – PPO | Source: Ambulatory Visit | Attending: Internal Medicine | Admitting: Internal Medicine

## 2020-12-05 ENCOUNTER — Other Ambulatory Visit: Payer: Self-pay

## 2020-12-05 VITALS — BP 124/82 | HR 83 | Wt 217.4 lb

## 2020-12-05 DIAGNOSIS — I10 Essential (primary) hypertension: Secondary | ICD-10-CM | POA: Diagnosis not present

## 2020-12-05 DIAGNOSIS — Z7984 Long term (current) use of oral hypoglycemic drugs: Secondary | ICD-10-CM | POA: Insufficient documentation

## 2020-12-05 DIAGNOSIS — I110 Hypertensive heart disease with heart failure: Secondary | ICD-10-CM | POA: Diagnosis not present

## 2020-12-05 DIAGNOSIS — Z8249 Family history of ischemic heart disease and other diseases of the circulatory system: Secondary | ICD-10-CM | POA: Diagnosis not present

## 2020-12-05 DIAGNOSIS — I428 Other cardiomyopathies: Secondary | ICD-10-CM | POA: Insufficient documentation

## 2020-12-05 DIAGNOSIS — I5022 Chronic systolic (congestive) heart failure: Secondary | ICD-10-CM

## 2020-12-05 DIAGNOSIS — Z79899 Other long term (current) drug therapy: Secondary | ICD-10-CM | POA: Diagnosis not present

## 2020-12-05 DIAGNOSIS — F1010 Alcohol abuse, uncomplicated: Secondary | ICD-10-CM | POA: Diagnosis not present

## 2020-12-05 LAB — BASIC METABOLIC PANEL
Anion gap: 6 (ref 5–15)
BUN: 23 mg/dL — ABNORMAL HIGH (ref 6–20)
CO2: 28 mmol/L (ref 22–32)
Calcium: 9.7 mg/dL (ref 8.9–10.3)
Chloride: 104 mmol/L (ref 98–111)
Creatinine, Ser: 1.11 mg/dL (ref 0.61–1.24)
GFR, Estimated: >60 mL/min (ref >60)
Glucose, Bld: 102 mg/dL — ABNORMAL HIGH (ref 70–99)
Potassium: 3.8 mmol/L (ref 3.5–5.1)
Sodium: 138 mmol/L (ref 135–145)

## 2020-12-05 LAB — BRAIN NATRIURETIC PEPTIDE: B Natriuretic Peptide: 165.8 pg/mL — ABNORMAL HIGH (ref 0.0–100.0)

## 2020-12-05 NOTE — Addendum Note (Signed)
Encounter addended by: Noralee Space, RN on: 12/05/2020 4:12 PM  Actions taken: Order list changed, Diagnosis association updated, Clinical Note Signed, Charge Capture section accepted

## 2020-12-05 NOTE — Progress Notes (Signed)
Patient ID: Brendan Rogers, male   DOB: 18-Aug-1966, 55 y.o.   MRN: [id number]    Advanced Heart Failure Clinic Note   Primary Care: Kirstie Peri, MD Primary Cardiologist: Previously Dr Antoine Poche but hasn't followed up in > 2 years Primary HF: Dr. Gala Romney   HPI:  Brendan Rogers is a 55 y.o. male with hx of NICM, HTN, HLD, and alcohol abuse.  Pt reported EF 10% in June 2006, cath with insignificant CAD. Increased to 55-60% in 2008 by echo with medical management.After he improved, he stopped all medicine except for baby ASA and fish oil.  Echo (01/04/16) LVEF 20-25% with akinesis of the anteroseptal myocardium. Mod MR. Mod LAE. Mildly dilated RV. Mod dilated RA, Mod TR, PA peak pressure 51 mm Hg.   Admitted 01/03/16 with volume overload. EF back down in setting of medical noncompliance and ETOH abuse. Meds titrated and diuresed with IV lasix 40 mg BID.  Out 6 L and down 8 lbs. Discharge weight 207 lbs on hospital scales.   Echo 11/19 EF read as 30-35%. I felt more like 40-45%. Digoxin stopped.   Echo 08/04/19 EF 25-30%. RV normal.   cMRI 7/21 1. Moderately dilated LV with diffuse hypokinesis worse in the septum, EF 25%. 2. Normal RV size with mild to moderately decreased systolic function, EF 35%. 3. There was not extensive LGE on delayed enhancement imaging. However, there was a small area of mid-wall LGE in the apical inferior wall. This is not a coronary disease pattern, possible evidence for prior myocarditis.  He presents for f/u. 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 Denies CP or SOB. No edema, orthopnea or PND. Compliant with meds. Still drinking about 1/2 pint about every other day   Studies:  CPX 01/13/16 FVC 2.85 (75%)    FEV1 2.38 (78%)     FEV1/FVC 83 (104%)     MVV 121 (85%)  Exercise Time:16:43 Speed (mph): 4.0  Grade (%): 16.5   RPE: 17 Reason stopped: Patient ended test due to overall fatigue. Additional symptoms: Dyspnea  (7/10) Resting HR: 101 Peak HR: 170  (100% age predicted max HR) BP rest: 102/78 BP peak: 160/76 Peak VO2: 30.7 (104% predicted peak VO2) VE/VCO2 slope: 31 OUES: 3.10 Peak RER: 1.06 Ventilatory Threshold: 18.9  (64% predicted or measured peak VO2) Peak RR 45 Peak Ventilation: 84.8 VE/MVV: 95% PETCO2 at peak: 38 O2pulse: 17  (106% predicted O2pulse)  Review of systems complete and found to be negative unless listed in HPI.    Past Medical History:  Diagnosis Date  . CHF (congestive heart failure) (HCC)    secondary to nonischemic cardiomyopathy, EF normal by echcardiogram  . Dyslipidemia   . History of ETOH abuse   . Hypertension    Current Outpatient Medications  Medication Sig Dispense Refill  . carvedilol (COREG) 25 MG tablet Take 1 tablet (25 mg total) by mouth 2 (two) times daily with a meal. 60 tablet 6  . ENTRESTO 97-103 MG Take 1 tablet by mouth twice daily 60 tablet 11  . FARXIGA 10 MG TABS tablet TAKE 1 TABLET BY MOUTH ONCE DAILY BEFORE BREAKFAST 90 tablet 3  . furosemide (LASIX) 40 MG tablet TAKE 1/2 (ONE-HALF) TABLET BY MOUTH AS NEEDED 45 tablet 3  . multivitamin (THERAGRAN) per tablet Take 1 tablet by mouth at bedtime.    . Omega-3 Fatty Acids (FISH OIL) 300 MG CAPS Take 1 capsule by mouth at bedtime.    . potassium chloride SA (  K-DUR) 20 MEQ tablet Take 1 tablet (20 mEq total) by mouth as needed. 90 tablet 3  . spironolactone (ALDACTONE) 25 MG tablet Take 1 tablet by mouth once daily 30 tablet 11   No current facility-administered medications for this encounter.   No Known Allergies  Social History   Socioeconomic History  . Marital status: Divorced    Spouse name: Not on file  . Number of children: Not on file  . Years of education: Not on file  . Highest education level: Not on file  Occupational History  . Not on file  Tobacco Use  . Smoking status: Never Smoker  . Smokeless tobacco: Never Used  Substance and Sexual Activity  . Alcohol  use: Yes    Comment: regularly, including 3 shots a day  . Drug use: Not on file  . Sexual activity: Not on file  Other Topics Concern  . Not on file  Social History Narrative  . Not on file   Social Determinants of Health   Financial Resource Strain: Not on file  Food Insecurity: Not on file  Transportation Needs: Not on file  Physical Activity: Not on file  Stress: Not on file  Social Connections: Not on file  Intimate Partner Violence: Not on file      Family History  Problem Relation Age of Onset  . Coronary artery disease Other    Vitals:   12/05/20 1534  BP: 124/82  Pulse: 83  SpO2: 97%  Weight: 98.6 kg (217 lb 6.4 oz)   Wt Readings from Last 3 Encounters:  12/05/20 98.6 kg (217 lb 6.4 oz)  05/03/20 96.8 kg (213 lb 6.4 oz)  11/23/19 98.9 kg (218 lb)    PHYSICAL EXAM: General:  Well appearing. No resp difficulty HEENT: normal Neck: supple. no JVD. Carotids 2+ bilat; no bruits. No lymphadenopathy or thryomegaly appreciated. Cor: PMI nondisplaced. Regular rate & rhythm. No rubs, gallops or murmurs. Lungs: clear Abdomen: soft, nontender, nondistended. No hepatosplenomegaly. No bruits or masses. Good bowel sounds. Extremities: no cyanosis, clubbing, rash, edema Neuro: alert & orientedx3, cranial nerves grossly intact. moves all 4 extremities w/o difficulty. Affect pleasant    ASSESSMENT & PLAN:  1. 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 Echo EF 30-35% in Dec. 2017.  - Echo 06/2017 LVEF 40-45%, improved from previous.  - Echo 11/19 LVEF read as 30-35% (I felt closer to 40-45%) - Echo 08/04/19 EF 25-30%. RV normal. Personally reviewed - cMRI 7/21 EF 25% mild LGE ? Previous myocarditis - Remains with stable NYHA I symptoms despite persistent severe LV dysfunction - Volume status stable on exam  Continue lasix 20mg  daily - Continue Entresto 97/103 mg BID.  - Continue carvedilol to 25 mg bid - Continue spiro 25 mg daily. - Continue Farxiga 10  daily - Consider Bidil down the road as BP tolerates  - NYHA I symptoms so no ICD.  - Discussed need to stop drinking ETOH as it may be causing his cardiomyopathy - will check repeat echo and blood work   2. HTN - Blood pressure well controlled. Continue current regimen.  3. ETOH abuse - Encouraged complete abstinence  , MD  3:58 PM

## 2020-12-05 NOTE — Patient Instructions (Signed)
Labs done today, we will call you for abnormal results  Your physician recommends that you schedule a follow-up appointment in: 3-4 months with an echocardiogram  If you have any questions or concerns before your next appointment please send [place] a message through [place] or call our office at [phone].    TO LEAVE A MESSAGE FOR THE NURSE SELECT OPTION 2, PLEASE LEAVE A MESSAGE INCLUDING: . YOUR NAME . DATE OF BIRTH . CALL BACK NUMBER . REASON FOR CALL**this is important as we prioritize the call backs  YOU WILL RECEIVE A CALL BACK THE SAME DAY AS LONG AS YOU CALL BEFORE 4:00 PM  At the [facility], you and your health needs are our priority. As part of our continuing mission to provide you with exceptional heart care, we have created designated Provider Care Teams. These Care Teams include your primary Cardiologist (physician) and Advanced Practice Providers (APPs- Physician Assistants and Nurse Practitioners) who all work together to provide you with the care you need, when you need it.   You may see any of the following providers on your designated Care Team at your next follow up: Marland Kitchen Dr Arvilla Meres . Dr Marca Ancona . Dr Thornell Mule . Tonye Becket, NP . Robbie Lis, PA . Shanda Bumps [place],NP . Karle Plumber, PharmD   Please be sure to bring in all your medications bottles to every appointment.

## 2021-01-03 ENCOUNTER — Other Ambulatory Visit (HOSPITAL_COMMUNITY): Payer: Self-pay | Admitting: Internal Medicine

## 2021-01-20 IMAGING — MR MR CARD MORPHOLOGY WO/W CM
45 of 48 series · 45 of 48 positions shown · IV contrast (Contrast agent)
Comparison: none

CLINICAL DATA: Cardiomyopathy of uncertain etiology.

EXAM:
CARDIAC MRI
TECHNIQUE: The patient was scanned on a 1.5 Tesla GE magnet. A dedicated
cardiac coil was used. Functional imaging was done using Fiesta
sequences. [DATE], and 4 chamber views were done to assess for RWMA's.
Modified [place] rule using a short axis stack was used to
calculate an ejection fraction on a dedicated work station using
Circle software. The patient received 10 cc of Gadavist. After 10
minutes inversion recovery sequences were used to assess for
infiltration and scar tissue.
CONTRAST:  Gadavist 10 cc

[Series 4: [id number] · axial · 8.0mm · 1.33mm/px · 1 of 18 slices shown]
[im 1/18]
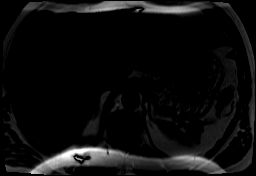

[Series 8: bSSFP · oblique · 8.0mm · 1.61mm/px · 1 of 25 slices shown (1 of 28)]
[im 1/25]
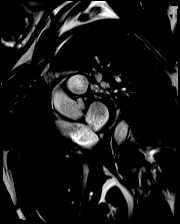

[Series 9: bSSFP · oblique · 8.0mm · 1.61mm/px · 1 of 25 slices shown (2 of 28)]
[im 1/25]
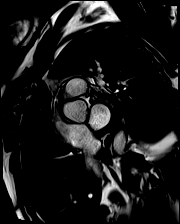

[Series 10: bSSFP · oblique · 8.0mm · 1.61mm/px · 1 of 25 slices shown (3 of 28)]
[im 1/25]
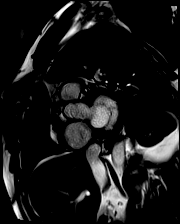

[Series 11: bSSFP · oblique · 8.0mm · 1.61mm/px · 1 of 25 slices shown (4 of 28)]
[im 1/25]
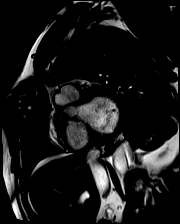

[Series 12: bSSFP · oblique · 8.0mm · 1.61mm/px · 1 of 25 slices shown (5 of 28)]
[im 1/25]
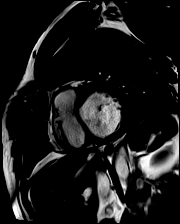

[Series 13: bSSFP · oblique · 8.0mm · 1.61mm/px · 1 of 25 slices shown (6 of 28)]
[im 1/25]
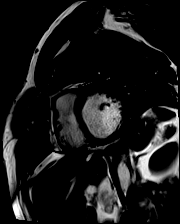

[Series 14: bSSFP · oblique · 8.0mm · 1.61mm/px · 1 of 25 slices shown (7 of 28)]
[im 1/25]
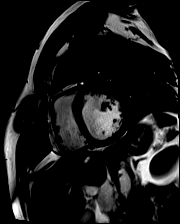

[Series 15: bSSFP · oblique · 8.0mm · 1.61mm/px · 1 of 25 slices shown (8 of 28)]
[im 1/25]
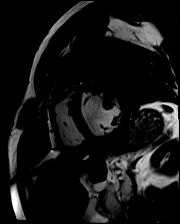

[Series 16: bSSFP · oblique · 8.0mm · 1.61mm/px · 1 of 25 slices shown (9 of 28)]
[im 1/25]
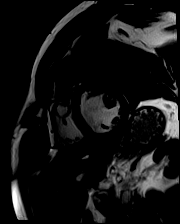

[Series 17: bSSFP · oblique · 8.0mm · 1.61mm/px · 1 of 25 slices shown (10 of 28)]
[im 1/25]
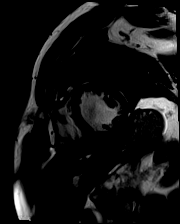

[Series 18: bSSFP · oblique · 8.0mm · 1.61mm/px · 1 of 25 slices shown (11 of 28)]
[im 1/25]
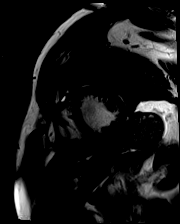

[Series 19: bSSFP · oblique · 8.0mm · 1.61mm/px · 1 of 25 slices shown (12 of 28)]
[im 1/25]
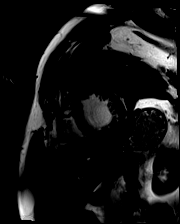

[Series 20: bSSFP · oblique · 8.0mm · 1.61mm/px · 1 of 25 slices shown (13 of 28)]
[im 1/25]
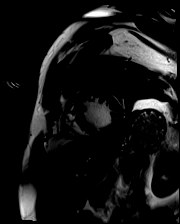

[Series 21: bSSFP · oblique · 8.0mm · 1.61mm/px · 1 of 25 slices shown (14 of 28)]
[im 1/25]
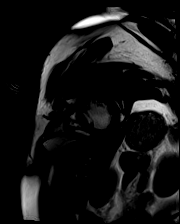

[Series 22: bSSFP · oblique · 8.0mm · 1.61mm/px · 1 of 25 slices shown (15 of 28)]
[im 1/25]
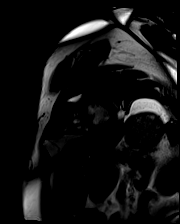

[Series 23: bSSFP · oblique · 8.0mm · 1.61mm/px · 1 of 25 slices shown (16 of 28)]
[im 1/25]
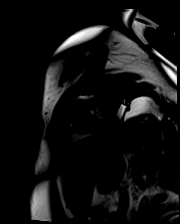

[Series 24: bSSFP · oblique · 8.0mm · 1.61mm/px · 1 of 25 slices shown (17 of 28)]
[im 1/25]
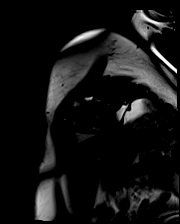

[Series 25: bSSFP · oblique · 8.0mm · 1.61mm/px · 1 of 25 slices shown (18 of 28)]
[im 1/25]
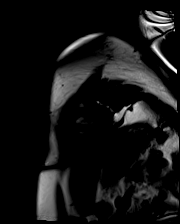

[Series 26: bSSFP · oblique · 8.0mm · 1.61mm/px · 1 of 25 slices shown (19 of 28)]
[im 1/25]
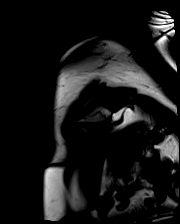

[Series 27: bSSFP · oblique · 8.0mm · 1.61mm/px · 1 of 25 slices shown (20 of 28)]
[im 1/25]
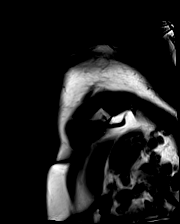

[Series 28: bSSFP · oblique · 6.0mm · 1.41mm/px · 1 of 25 slices shown (21 of 28)]
[im 1/25]
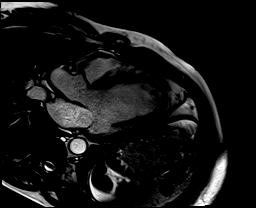

[Series 29: bSSFP · oblique · 6.0mm · 1.41mm/px · 1 of 25 slices shown (22 of 28)]
[im 1/25]
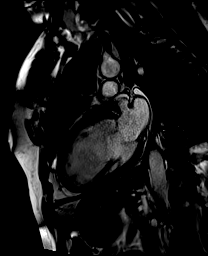

[Series 30: bSSFP · axial · 6.0mm · 1.41mm/px · 1 of 25 slices shown (23 of 28)]
[im 1/25]
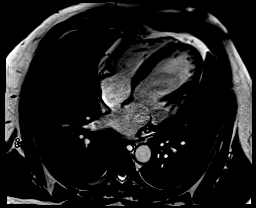

[Series 31: (id)_long_t1 · oblique · 8.0mm · 1.41mm/px · 1 of 24 slices shown]
[im 1/24]
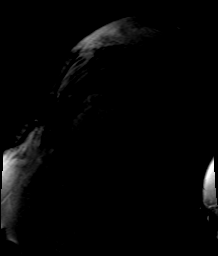

[Series 32: (id)_long_t1_moco · oblique · 8.0mm · 1.41mm/px · 1 of 24 slices shown]
[im 1/24]
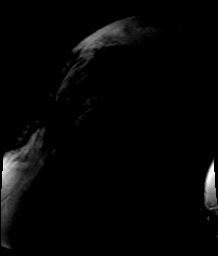

[Series 35: (id)_trufi · oblique · 8.0mm · 1.88mm/px · 1 of 9 slices shown]
[im 1/9]
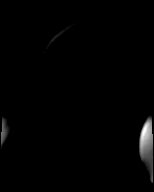

[Series 36: (id)_trufi_moco · oblique · 8.0mm · 1.88mm/px · 1 of 9 slices shown]
[im 1/9]
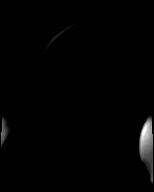

[Series 39: bSSFP · coronal · 6.0mm · 1.41mm/px · 1 of 25 slices shown (24 of 28)]
[im 1/25]
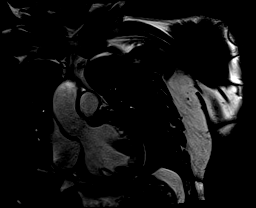

[Series 40: cine rvit · oblique · 6.0mm · 1.41mm/px · 1 of 25 slices shown]
[im 1/25]
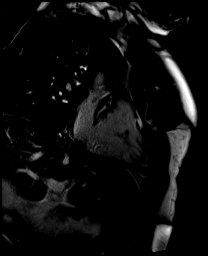

[Series 41: aortic valve cine · oblique · 6.0mm · 1.41mm/px · 1 of 25 slices shown]
[im 1/25]
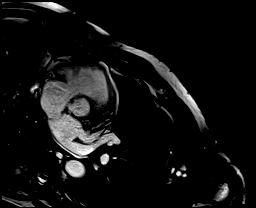

[Series 42: cine rvot · sagittal · 6.0mm · 1.41mm/px · 1 of 25 slices shown]
[im 1/25]
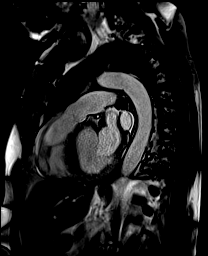

[Series 44: lge_single shot sa · oblique · 8.0mm · 1.98mm/px · 1 of 12 slices shown (1 of 2)]
[im 1/12]
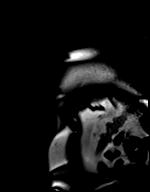

[Series 45: lge_single shot sa · oblique · 8.0mm · 1.98mm/px · 1 of 12 slices shown (2 of 2)]
[im 1/12]
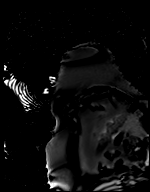

[Series 48: lge_single shot 4 · axial · 6.0mm · 1.98mm/px · 1 of 1 slices shown (1 of 2)]
[im 1/1]
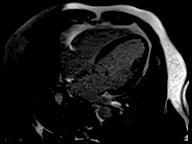

[Series 49: lge_single shot 4 · axial · 6.0mm · 1.98mm/px · 1 of 1 slices shown (2 of 2)]
[im 1/1]
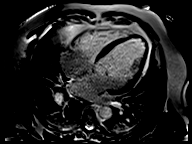

[Series 50: lge_single shot 3 · axial · 6.0mm · 1.98mm/px · 1 of 1 slices shown (1 of 2)]
[im 1/1]
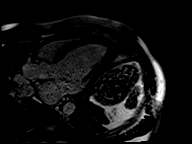

[Series 51: lge_single shot 3 · axial · 6.0mm · 1.98mm/px · 1 of 1 slices shown (2 of 2)]
[im 1/1]
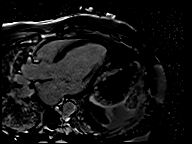

[Series 52: (id)_short_t1 · oblique · 8.0mm · 1.41mm/px · 1 of 27 slices shown]
[im 1/27]
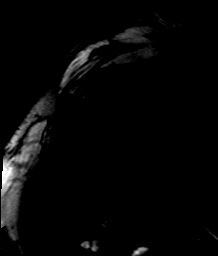

[Series 53: (id)_short_t1_moco · oblique · 8.0mm · 1.41mm/px · 1 of 27 slices shown]
[im 1/27]
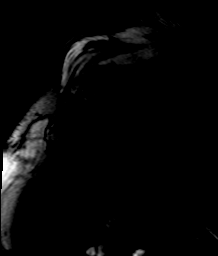

[Series 54: (id)_short_t1_moco_t1 · oblique · 8.0mm · 1.41mm/px · 1 of 6 slices shown]
[im 1/6]
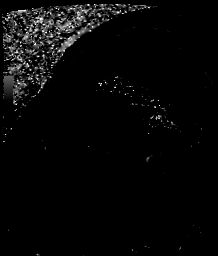

[Series 57: bSSFP · oblique · 6.0mm · 1.73mm/px · 1 of 20 slices shown (25 of 28)]
[im 1/20]
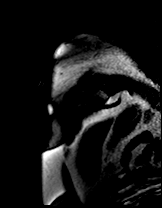

[Series 58: bSSFP · oblique · 6.0mm · 1.73mm/px · 1 of 20 slices shown (26 of 28)]
[im 1/20]
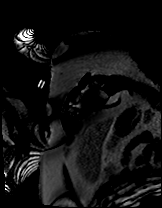

[Series 61: bSSFP · oblique · 6.0mm · 1.92mm/px · 1 of 20 slices shown (27 of 28)]
[im 1/20]
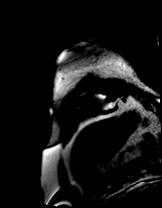

[Series 62: bSSFP · oblique · 6.0mm · 1.92mm/px · 1 of 20 slices shown (28 of 28)]
[im 1/20]
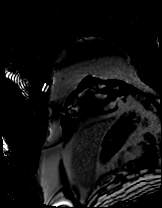

[45 of 48 positions shown; findings below may reference images not displayed]

FINDINGS: Limited images of the lung fields showed no gross abnormalities.

Moderately dilated left ventricle with normal wall thickness.
Diffuse hypokinesis worse in the septum, EF 25%. Normal right
ventricular size with mild to moderately decreased systolic
function, EF 35%. Mildly dilated left atrium, normal right atrium.
Trileaflet aortic valve with no significant regurgitation or
stenosis. Mild mitral regurgitation.

On delayed enhancement imaging, there was a small area of mid-wall
LGE in the apical inferior wall.

Measurements:

LVEDV 334 mL

LVSV 84 mL
LVEF 25%

RVEDV 144 mL
RVSV 50 mL
RVEF 35%

Extracellular volume percentage: 30%
IMPRESSION: 1. Moderately dilated LV with diffuse hypokinesis worse in the
septum, EF 25%.

2. Normal RV size with mild to moderately decreased systolic
function, EF 35%.

3. There was not extensive LGE on delayed enhancement imaging.
However, there was a small area of mid-wall LGE in the apical
inferior wall. This is not a coronary disease pattern, possible
evidence for prior myocarditis.

[place]

## 2021-01-23 DIAGNOSIS — Z0000 Encounter for general adult medical examination without abnormal findings: Secondary | ICD-10-CM | POA: Diagnosis not present

## 2021-01-23 DIAGNOSIS — Z13220 Encounter for screening for lipoid disorders: Secondary | ICD-10-CM | POA: Diagnosis not present

## 2021-03-20 ENCOUNTER — Encounter (HOSPITAL_COMMUNITY): Payer: BC Managed Care – PPO | Admitting: Internal Medicine

## 2021-03-20 ENCOUNTER — Ambulatory Visit (HOSPITAL_COMMUNITY): Payer: BC Managed Care – PPO

## 2021-04-24 ENCOUNTER — Other Ambulatory Visit (HOSPITAL_COMMUNITY): Payer: Self-pay | Admitting: Internal Medicine

## 2021-06-05 ENCOUNTER — Other Ambulatory Visit (HOSPITAL_COMMUNITY): Payer: Self-pay | Admitting: Internal Medicine

## 2021-07-03 ENCOUNTER — Ambulatory Visit (HOSPITAL_BASED_OUTPATIENT_CLINIC_OR_DEPARTMENT_OTHER)
Admission: RE | Admit: 2021-07-03 | Discharge: 2021-07-03 | Disposition: A | Discharge status: Home / Self Care | Payer: BC Managed Care – PPO | Source: Ambulatory Visit | Attending: Internal Medicine | Admitting: Internal Medicine

## 2021-07-03 ENCOUNTER — Encounter (HOSPITAL_COMMUNITY): Payer: Self-pay | Admitting: Internal Medicine

## 2021-07-03 ENCOUNTER — Ambulatory Visit (HOSPITAL_COMMUNITY)
Admission: RE | Admit: 2021-07-03 | Discharge: 2021-07-03 | Disposition: A | Discharge status: Home / Self Care | Payer: BC Managed Care – PPO | Source: Ambulatory Visit | Attending: Internal Medicine | Admitting: Internal Medicine

## 2021-07-03 ENCOUNTER — Other Ambulatory Visit: Payer: Self-pay

## 2021-07-03 ENCOUNTER — Other Ambulatory Visit (HOSPITAL_COMMUNITY): Payer: Self-pay | Admitting: Internal Medicine

## 2021-07-03 VITALS — BP 104/60 | HR 73 | Wt 210.8 lb

## 2021-07-03 DIAGNOSIS — F1010 Alcohol abuse, uncomplicated: Secondary | ICD-10-CM | POA: Diagnosis not present

## 2021-07-03 DIAGNOSIS — I5022 Chronic systolic (congestive) heart failure: Secondary | ICD-10-CM | POA: Insufficient documentation

## 2021-07-03 DIAGNOSIS — I10 Essential (primary) hypertension: Secondary | ICD-10-CM

## 2021-07-03 DIAGNOSIS — I110 Hypertensive heart disease with heart failure: Secondary | ICD-10-CM | POA: Diagnosis not present

## 2021-07-03 LAB — ECHOCARDIOGRAM COMPLETE
Area-P 1/2: 4.57 cm2
Calc EF: 28.8 %
S' Lateral: 6.4 cm
Single Plane A2C EF: 31.3 %
Single Plane A4C EF: 30.8 %

## 2021-07-03 NOTE — Addendum Note (Signed)
Encounter addended by: Suezanne Cheshire, RN on: 07/03/2021 3:59 PM  Actions taken: Clinical Note Signed

## 2021-07-03 NOTE — Patient Instructions (Signed)
Nice to see you today!  No medication changes were made  Your physician recommends that you schedule a follow-up appointment in: 6 months. Call our office in March for April 2023 appointment  If you have any questions or concerns before your next appointment please send [place] a message through [place] or call our office at [phone].    TO LEAVE A MESSAGE FOR THE NURSE SELECT OPTION 2, PLEASE LEAVE A MESSAGE INCLUDING: YOUR NAME DATE OF BIRTH CALL BACK NUMBER REASON FOR CALL**this is important as we prioritize the call backs  YOU WILL RECEIVE A CALL BACK THE SAME DAY AS LONG AS YOU CALL BEFORE 4:00 PM   At the [facility], you and your health needs are our priority. As part of our continuing mission to provide you with exceptional heart care, we have created designated Provider Care Teams. These Care Teams include your primary Cardiologist (physician) and Advanced Practice Providers (APPs- Physician Assistants and Nurse Practitioners) who all work together to provide you with the care you need, when you need it.   You may see any of the following providers on your designated Care Team at your next follow up: Dr Arvilla Meres Dr Carron Curie, NP Robbie Lis, [place] [facility] [place], [place] Karle Plumber, PharmD   Please be sure to bring in all your medications bottles to every appointment.

## 2021-07-03 NOTE — Addendum Note (Signed)
Encounter addended by: Noralee Space, RN on: 07/03/2021 3:56 PM  Actions taken: Letter saved

## 2021-07-03 NOTE — Progress Notes (Signed)
Patient ID: Brendan Rogers, male   DOB: [birth year], 55 y.o.   MRN: [id number]    Advanced Heart Failure Clinic Note   Primary Care: Scifres, Nicole Cella, PA-C Primary Cardiologist: Previously Dr Antoine Poche but hasn't followed up in > 2 years Primary HF: Dr. Gala Romney   HPI:  Brendan Rogers is a 55 y.o. male with hx of NICM, HTN, HLD, and alcohol abuse.  Pt reported EF 10% in June 2006, cath with insignificant CAD. Increased to 55-60% in 2008 by echo with medical management.  After he improved, he stopped all medicine except for baby ASA and fish oil.  Admitted 4/17 with volume overload. EF back down in setting of medical noncompliance and ETOH abuse.  EF 20-25%  Echo 11/19 EF read as 30-35%. I felt more like 40-45%. Digoxin stopped.  Echo 08/04/19 EF 25-30%. RV normal.   cMRI 7/21 1. Moderately dilated LV with diffuse hypokinesis worse in the septum, EF 25%. 2. Normal RV size with mild to moderately decreased systolic function, EF 35%. 3. There was not extensive LGE on delayed enhancement imaging. However, there was a small area of mid-wall LGE in the apical inferior wall. This is not a coronary disease pattern, possible evidence for prior myocarditis.  He presents for f/u. 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 No SOB, edema, orthopnea or PND. Compliant with all meds. Says he cut back on his drinking. Still drinking several glasses of red wine every day.   Echo today 07/03/21  EF 20-25% RV normal.   Studies:  CPX 01/13/16 FVC 2.85 (75%)      FEV1 2.38 (78%)        FEV1/FVC 83 (104%)        MVV 121 (85%)  Exercise Time: 16:43  Speed (mph): 4.0  Grade (%): 16.5     RPE: 17 Reason stopped: Patient ended test due to overall fatigue. Additional symptoms: Dyspnea (7/10) Resting HR: 101 Peak HR: 170   (100% age predicted max HR) BP rest: 102/78 BP peak: 160/76 Peak VO2: 30.7 (104% predicted peak VO2) VE/VCO2 slope:   31 OUES: 3.10 Peak RER: 1.06 Ventilatory Threshold: 18.9   (64% predicted or measured peak VO2) Peak RR 45 Peak Ventilation:  84.8 VE/MVV:  95% PETCO2 at peak:  38 O2pulse:  17   (106% predicted O2pulse)  Review of systems complete and found to be negative unless listed in HPI.    Past Medical History:  Diagnosis Date   CHF (congestive heart failure) (HCC)    secondary to nonischemic cardiomyopathy, EF normal by echcardiogram   Dyslipidemia    History of ETOH abuse    Hypertension    Current Outpatient Medications  Medication Sig Dispense Refill   carvedilol (COREG) 25 MG tablet TAKE 1 TABLET BY MOUTH TWICE DAILY WITH  A  MEAL 60 tablet 11   ENTRESTO 97-103 MG Take 1 tablet by mouth twice daily 60 tablet 2   FARXIGA 10 MG TABS tablet TAKE 1 TABLET BY MOUTH ONCE DAILY BEFORE BREAKFAST 30 tablet 2   furosemide (LASIX) 40 MG tablet TAKE 1/2 (ONE-HALF) TABLET BY MOUTH AS NEEDED 45 tablet 3   multivitamin (THERAGRAN) per tablet Take 1 tablet by mouth at bedtime.     Omega-3 Fatty Acids (FISH OIL) 300 MG CAPS Take 1 capsule by mouth at bedtime.     potassium chloride SA (K-DUR) 20 MEQ tablet Take 1 tablet (20 mEq total) by mouth as needed.  90 tablet 3   spironolactone (ALDACTONE) 25 MG tablet Take 1 tablet by mouth once daily 30 tablet 4   No current facility-administered medications for this encounter.   No Known Allergies  Social History   Socioeconomic History   Marital status: Divorced    Spouse name: Not on file   Number of children: Not on file   Years of education: Not on file   Highest education level: Not on file  Occupational History   Not on file  Tobacco Use   Smoking status: Never   Smokeless tobacco: Never  Substance and Sexual Activity   Alcohol use: Yes    Comment: regularly, including 3 shots a day   Drug use: Not on file   Sexual activity: Not on file  Other Topics Concern   Not on file  Social History Narrative   Not on file   Social  Determinants of Health   Financial Resource Strain: Not on file  Food Insecurity: Not on file  Transportation Needs: Not on file  Physical Activity: Not on file  Stress: Not on file  Social Connections: Not on file  Intimate Partner Violence: Not on file      Family History  Problem Relation Age of Onset   Coronary artery disease Other    Vitals:   07/03/21 1500  BP: 104/60  Pulse: 73  SpO2: 99%  Weight: 95.6 kg (210 lb 12.8 oz)    Wt Readings from Last 3 Encounters:  07/03/21 95.6 kg (210 lb 12.8 oz)  12/05/20 98.6 kg (217 lb 6.4 oz)  05/03/20 96.8 kg (213 lb 6.4 oz)    PHYSICAL EXAM: General:  Well appearing. No resp difficulty HEENT: normal Neck: supple. no JVD. Carotids 2+ bilat; no bruits. No lymphadenopathy or thryomegaly appreciated. Cor: PMI nondisplaced. Regular rate & rhythm. No rubs, gallops or murmurs. Lungs: clear Abdomen: soft, nontender, nondistended. No hepatosplenomegaly. No bruits or masses. Good bowel sounds. Extremities: no cyanosis, clubbing, rash, edema Neuro: alert & orientedx3, cranial nerves grossly intact. moves all 4 extremities w/o difficulty. Affect pleasant   ASSESSMENT & PLAN:  1. 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 Echo EF 30-35% in Dec. 2017.  - Echo 06/2017 LVEF 40-45%, improved from previous.  - Echo 11/19 LVEF read as 30-35% (I felt closer to 40-45%) - Echo 08/04/19 EF 25-30%. RV normal. - cMRI 7/21 EF 25% mild LGE ? Previous myocarditis - Echo today 07/03/21  EF 20-25% RV normal.  - Remains with stable  NYHA I symptoms despite persistent severe LV dysfunction - Volume status stable. Lasix 20mg  daily - Continue Entresto 97/103 mg BID.  - Continue carvedilol to 25 mg bid - Continue spiro 25 mg daily. - Continue Farxiga 10 daily - Consider Bidil down the road as BP tolerates  - NYHA I symptoms so no ICD.  - Discussed need to stop drinking ETOH as it may be causing his cardiomyopathy - No change -  recent bloodwork from his PCP ok   2. HTN - Blood pressure well controlled. Continue current regimen.  3. ETOH abuse - Encouraged complete abstinence  , MD  3:48 PM

## 2021-08-27 ENCOUNTER — Other Ambulatory Visit (HOSPITAL_COMMUNITY): Payer: Self-pay | Admitting: Internal Medicine

## 2021-09-26 ENCOUNTER — Other Ambulatory Visit (HOSPITAL_COMMUNITY): Payer: Self-pay | Admitting: Internal Medicine

## 2021-10-09 ENCOUNTER — Other Ambulatory Visit (HOSPITAL_COMMUNITY): Payer: Self-pay | Admitting: Internal Medicine

## 2021-10-26 ENCOUNTER — Other Ambulatory Visit (HOSPITAL_COMMUNITY): Payer: Self-pay | Admitting: Internal Medicine

## 2021-11-25 ENCOUNTER — Other Ambulatory Visit (HOSPITAL_COMMUNITY): Payer: Self-pay | Admitting: Internal Medicine

## 2022-01-23 ENCOUNTER — Other Ambulatory Visit (HOSPITAL_COMMUNITY): Payer: Self-pay | Admitting: Internal Medicine

## 2022-01-29 DIAGNOSIS — Z125 Encounter for screening for malignant neoplasm of prostate: Secondary | ICD-10-CM | POA: Diagnosis not present

## 2022-01-29 DIAGNOSIS — E669 Obesity, unspecified: Secondary | ICD-10-CM | POA: Diagnosis not present

## 2022-01-29 DIAGNOSIS — Z23 Encounter for immunization: Secondary | ICD-10-CM | POA: Diagnosis not present

## 2022-01-29 DIAGNOSIS — F1010 Alcohol abuse, uncomplicated: Secondary | ICD-10-CM | POA: Diagnosis not present

## 2022-01-29 DIAGNOSIS — Z13220 Encounter for screening for lipoid disorders: Secondary | ICD-10-CM | POA: Diagnosis not present

## 2022-01-29 DIAGNOSIS — J302 Other seasonal allergic rhinitis: Secondary | ICD-10-CM | POA: Diagnosis not present

## 2022-01-29 DIAGNOSIS — Z0000 Encounter for general adult medical examination without abnormal findings: Secondary | ICD-10-CM | POA: Diagnosis not present

## 2022-01-29 DIAGNOSIS — I10 Essential (primary) hypertension: Secondary | ICD-10-CM | POA: Diagnosis not present

## 2022-02-01 ENCOUNTER — Encounter (INDEPENDENT_AMBULATORY_CARE_PROVIDER_SITE_OTHER): Payer: Self-pay | Admitting: *Deleted

## 2022-02-20 ENCOUNTER — Encounter (HOSPITAL_COMMUNITY): Payer: BC Managed Care – PPO | Admitting: Internal Medicine

## 2022-03-08 ENCOUNTER — Other Ambulatory Visit (HOSPITAL_COMMUNITY): Payer: Self-pay | Admitting: Internal Medicine

## 2022-03-09 ENCOUNTER — Other Ambulatory Visit (HOSPITAL_COMMUNITY): Payer: Self-pay | Admitting: *Deleted

## 2022-03-09 MED ORDER — SPIRONOLACTONE 25 MG PO TABS: 25.0000 mg | ORAL_TABLET | Freq: Every day | ORAL | 3 refills | Status: DC

## 2022-03-09 MED ORDER — FUROSEMIDE 20 MG PO TABS: 20.0000 mg | ORAL_TABLET | Freq: Every day | ORAL | 3 refills | Status: DC | PRN

## 2022-04-05 ENCOUNTER — Other Ambulatory Visit (HOSPITAL_COMMUNITY): Payer: Self-pay

## 2022-04-06 ENCOUNTER — Telehealth (HOSPITAL_COMMUNITY): Payer: Self-pay | Admitting: Pharmacy Technician

## 2022-04-06 NOTE — Telephone Encounter (Signed)
Advanced Heart Failure Patient Advocate Encounter  Prior Authorization for Marcelline Deist has been submitted and approved.   PA# [id number] Effective dates: 04/05/22 through 04/06/23  Archer Asa, CPhT

## 2022-05-07 DIAGNOSIS — D649 Anemia, unspecified: Secondary | ICD-10-CM | POA: Diagnosis not present

## 2022-06-06 ENCOUNTER — Encounter (HOSPITAL_COMMUNITY): Payer: Self-pay

## 2022-06-06 ENCOUNTER — Ambulatory Visit (HOSPITAL_COMMUNITY)
Admission: RE | Admit: 2022-06-06 | Discharge: 2022-06-06 | Disposition: A | Discharge status: Home / Self Care | Payer: BC Managed Care – PPO | Source: Ambulatory Visit | Attending: Internal Medicine | Admitting: Internal Medicine

## 2022-06-06 VITALS — BP 106/74 | HR 91 | Wt 207.4 lb

## 2022-06-06 DIAGNOSIS — I428 Other cardiomyopathies: Secondary | ICD-10-CM | POA: Diagnosis not present

## 2022-06-06 DIAGNOSIS — I447 Left bundle-branch block, unspecified: Secondary | ICD-10-CM | POA: Diagnosis not present

## 2022-06-06 DIAGNOSIS — D509 Iron deficiency anemia, unspecified: Secondary | ICD-10-CM | POA: Diagnosis not present

## 2022-06-06 DIAGNOSIS — Z8679 Personal history of other diseases of the circulatory system: Secondary | ICD-10-CM | POA: Diagnosis not present

## 2022-06-06 DIAGNOSIS — F1010 Alcohol abuse, uncomplicated: Secondary | ICD-10-CM | POA: Diagnosis not present

## 2022-06-06 DIAGNOSIS — Z7984 Long term (current) use of oral hypoglycemic drugs: Secondary | ICD-10-CM | POA: Insufficient documentation

## 2022-06-06 DIAGNOSIS — I10 Essential (primary) hypertension: Secondary | ICD-10-CM | POA: Diagnosis not present

## 2022-06-06 DIAGNOSIS — I110 Hypertensive heart disease with heart failure: Secondary | ICD-10-CM | POA: Insufficient documentation

## 2022-06-06 DIAGNOSIS — I5022 Chronic systolic (congestive) heart failure: Secondary | ICD-10-CM

## 2022-06-06 DIAGNOSIS — I2510 Atherosclerotic heart disease of native coronary artery without angina pectoris: Secondary | ICD-10-CM | POA: Insufficient documentation

## 2022-06-06 DIAGNOSIS — E785 Hyperlipidemia, unspecified: Secondary | ICD-10-CM | POA: Diagnosis not present

## 2022-06-06 MED ORDER — CARVEDILOL 25 MG PO TABS: 25.0000 mg | ORAL_TABLET | Freq: Two times a day (BID) | ORAL | 3 refills | Status: AC

## 2022-06-06 NOTE — Patient Instructions (Signed)
There has been no changes to your medications.  Your physician has requested that you have an echocardiogram. Echocardiography is a painless test that uses sound waves to create images of your heart. It provides your doctor with information about the size and shape of your heart and how well your heart's chambers and valves are working. This procedure takes approximately one hour. There are no restrictions for this procedure.  Your physician recommends that you schedule a follow-up appointment in: 6 months ( March 2024) ** please call the office in January to arrange your follow up appointment **   If you have any questions or concerns before your next appointment please send us a message through mychart or call our office at [phone].    TO LEAVE A MESSAGE FOR THE NURSE SELECT OPTION 2, PLEASE LEAVE A MESSAGE INCLUDING: YOUR NAME DATE OF BIRTH CALL BACK NUMBER REASON FOR CALL**this is important as we prioritize the call backs  YOU WILL RECEIVE A CALL BACK THE SAME DAY AS LONG AS YOU CALL BEFORE 4:00 PM  At the [facility], you and your health needs are our priority. As part of our continuing mission to provide you with exceptional heart care, we have created designated Provider Care Teams. These Care Teams include your primary Cardiologist (physician) and Advanced Practice Providers (APPs- Physician Assistants and Nurse Practitioners) who all work together to provide you with the care you need, when you need it.   You may see any of the following providers on your designated Care Team at your next follow up: Dr Daniel Bensimhon Dr Dalton McLean Dr. Aditya Sabharwal Amy Clegg, NP Brittainy Simmons, PA Jessica [place]ford,NP Lindsay Finch, PA Alma Diaz, NP Lauren Kemp, PharmD   Please be sure to bring in all your medications bottles to every appointment.    

## 2022-06-06 NOTE — Addendum Note (Signed)
Encounter addended by: Jerl Mina, RN on: 06/06/2022 3:55 PM  Actions taken: Pharmacy for encounter modified, Order list changed, Diagnosis association updated, Clinical Note Signed

## 2022-06-06 NOTE — Progress Notes (Signed)
Patient ID: NICKOLAS CHALFIN, male   DOB: 1966/08/09, 56 y.o.   MRN: [id number]    Advanced Heart Failure Clinic Note   Primary Care: Almedia Balls, NP Primary Cardiologist: Previously Dr Percival Spanish but hasn't followed up in > 2 years Primary HF: Dr. Haroldine Laws   HPI:  BOGDAN VIVONA is a 56 y.o. male with hx of NICM, HTN, HLD, and alcohol abuse.  Pt reported EF 10% in June 2006, cath with insignificant CAD. Increased to 55-60% in 2008 by echo with medical management.  After he improved, he stopped all medicine except for baby ASA and fish oil.  Admitted 4/17 with volume overload. EF back down in setting of medical noncompliance and ETOH abuse.  EF 20-25%  Echo 11/19 EF read as 30-35%. I felt more like 40-45%. Digoxin stopped.  Echo 08/04/19 EF 25-30%. RV normal.   cMRI 7/21 1. Moderately dilated LV with diffuse hypokinesis worse in the septum, EF 25%. 2. Normal RV size with mild to moderately decreased systolic function, EF 51%. 3. There was not extensive LGE on delayed enhancement imaging. However, there was a small area of mid-wall LGE in the apical inferior wall. This is not a coronary disease pattern, possible evidence for prior myocarditis.  Echo 07/03/21  EF 20-25% RV normal.   He presents for f/u. Doing well. Denies SOB, CP, orthopnea or PND. Takes lasix 20 mg daily. Takes extra as needed. Recently found to have Fe-deficient anemia. Started on iron without benefit. No melena, BRBPR. Drinking 2 shots of vodka per day    Studies:  CPX 01/13/16 FVC 2.85 (75%)      FEV1 2.38 (78%)        FEV1/FVC 83 (104%)        MVV 121 (85%)  Exercise Time: 16:43  Speed (mph): 4.0  Grade (%): 16.5     RPE: 17 Reason stopped: Patient ended test due to overall fatigue. Additional symptoms: Dyspnea (7/10) Resting HR: 101 Peak HR: 170   (100% age predicted max HR) BP rest: 102/78 BP peak: 160/76 Peak VO2: 30.7 (104% predicted peak VO2) VE/VCO2 slope:  31 OUES: 3.10 Peak RER:  1.06 Ventilatory Threshold: 18.9   (64% predicted or measured peak VO2) Peak RR 45 Peak Ventilation:  84.8 VE/MVV:  95% PETCO2 at peak:  38 O2pulse:  17   (106% predicted O2pulse)  Review of systems complete and found to be negative unless listed in HPI.    Past Medical History:  Diagnosis Date   CHF (congestive heart failure) (HCC)    secondary to nonischemic cardiomyopathy, EF normal by echcardiogram   Dyslipidemia    History of ETOH abuse    Hypertension    Current Outpatient Medications  Medication Sig Dispense Refill   carvedilol (COREG) 25 MG tablet TAKE 1 TABLET BY MOUTH TWICE DAILY WITH A MEAL 60 tablet 3   cetirizine (ZYRTEC) 10 MG tablet Take 10 mg by mouth daily.     ENTRESTO 97-103 MG Take 1 tablet by mouth twice daily 180 tablet 3   FARXIGA 10 MG TABS tablet TAKE 1 TABLET BY MOUTH ONCE DAILY BEFORE BREAKFAST 30 tablet 11   FEROSUL 325 (65 Fe) MG tablet Take 325 mg by mouth daily.     furosemide (LASIX) 20 MG tablet Take 1 tablet (20 mg total) by mouth daily as needed. 30 tablet 3   multivitamin (THERAGRAN) per tablet Take 1 tablet by mouth at bedtime.     Omega-3 Fatty Acids (FISH OIL) 300 MG  CAPS Take 1 capsule by mouth at bedtime.     spironolactone (ALDACTONE) 25 MG tablet Take 1 tablet (25 mg total) by mouth daily. 30 tablet 3   potassium chloride SA (K-DUR) 20 MEQ tablet Take 1 tablet (20 mEq total) by mouth as needed. (Patient not taking: Reported on 06/06/2022) 90 tablet 3   No current facility-administered medications for this encounter.   No Known Allergies  Social History   Socioeconomic History   Marital status: Divorced    Spouse name: Not on file   Number of children: Not on file   Years of education: Not on file   Highest education level: Not on file  Occupational History   Not on file  Tobacco Use   Smoking status: Never   Smokeless tobacco: Never  Substance and Sexual Activity   Alcohol use: Yes    Comment: regularly, including 3 shots a  day   Drug use: Not on file   Sexual activity: Not on file  Other Topics Concern   Not on file  Social History Narrative   Not on file   Social Determinants of Health   Financial Resource Strain: Not on file  Food Insecurity: Not on file  Transportation Needs: Not on file  Physical Activity: Not on file  Stress: Not on file  Social Connections: Not on file  Intimate Partner Violence: Not on file      Family History  Problem Relation Age of Onset   Coronary artery disease Other    Vitals:   06/06/22 1451  BP: 106/74  Pulse: 91  SpO2: 97%  Weight: 94.1 kg (207 lb 6.4 oz)     Wt Readings from Last 3 Encounters:  06/06/22 94.1 kg (207 lb 6.4 oz)  07/03/21 95.6 kg (210 lb 12.8 oz)  12/05/20 98.6 kg (217 lb 6.4 oz)    PHYSICAL EXAM: General:  Well appearing. No resp difficulty HEENT: normal Neck: supple. no JVD. Carotids 2+ bilat; no bruits. No lymphadenopathy or thryomegaly appreciated. Cor: PMI nondisplaced. Regular rate & rhythm. No rubs, gallops or murmurs. Lungs: clear Abdomen: soft, nontender, nondistended. No hepatosplenomegaly. No bruits or masses. Good bowel sounds. Extremities: no cyanosis, clubbing, rash, edema Neuro: alert & orientedx3, cranial nerves grossly intact. moves all 4 extremities w/o difficulty. Affect pleasant  ECG: NSR 91 LBBB ( 156 ms) Personally reviewed    ASSESSMENT & PLAN:  1. 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 Echo EF 30-35% in Dec. 2017.  - Echo 06/2017 LVEF 40-45%, improved from previous.  - Echo 11/19 LVEF read as 30-35% (I felt closer to 40-45%) - Echo 08/04/19 EF 25-30%. RV normal. - cMRI 7/21 EF 25% mild LGE ? Previous myocarditis - Echo 07/03/21  EF 20-25% RV normal.  - Remains with stable  NYHA I symptoms despite persistent severe LV dysfunction - LBBB is new so doubt cause of cardiomyopathy - Volume status stable. Lasix 20mg  daily - Continue Entresto 97/103 mg BID.  - Continue carvedilol to 25  mg bid - Continue spiro 25 mg daily. - Continue Farxiga 10 daily - Consider Bidil down the road as BP tolerates  - Stable NYHA I  symptoms so no ICD.  - Discussed need to stop drinking ETOH as it may be causing his cardiomyopathy - Recent labs ok  - Needs f/u echo if EF still down and LBBB persists can consider CRT  2. HTN - Blood pressure well controlled. Continue current regimen.   3.  ETOH abuse - Again encouraged complete abstinence   4. Fe- deficient anemia - needs PCP referral to GI for scope and IV iron  Arvilla Meres, MD  3:47 PM

## 2022-07-16 ENCOUNTER — Encounter (INDEPENDENT_AMBULATORY_CARE_PROVIDER_SITE_OTHER): Payer: Self-pay | Admitting: *Deleted

## 2022-07-20 ENCOUNTER — Other Ambulatory Visit (HOSPITAL_COMMUNITY): Payer: Self-pay | Admitting: Internal Medicine

## 2022-07-30 ENCOUNTER — Encounter (INDEPENDENT_AMBULATORY_CARE_PROVIDER_SITE_OTHER): Payer: Self-pay | Admitting: *Deleted

## 2022-08-01 ENCOUNTER — Telehealth (HOSPITAL_COMMUNITY): Payer: Self-pay | Admitting: *Deleted

## 2022-08-01 DIAGNOSIS — I5022 Chronic systolic (congestive) heart failure: Secondary | ICD-10-CM

## 2022-08-01 MED ORDER — FUROSEMIDE 20 MG PO TABS: ORAL_TABLET | ORAL | 11 refills | Status: DC

## 2022-08-01 MED ORDER — SPIRONOLACTONE 25 MG PO TABS: 25.0000 mg | ORAL_TABLET | Freq: Every day | ORAL | 3 refills | Status: DC

## 2022-08-01 NOTE — Telephone Encounter (Signed)
Pts girlfriend called to report pt is c/o shortness of breath while laying down and notices swelling in his abdomen. Pt took an additional lasix yesterday and said it helped but wants to increase his dose to lasix 20mg  bid instead of daily. Per ok to in crease get bmet next week and next available with app. Pt aware appts scheduled.

## 2022-08-07 ENCOUNTER — Ambulatory Visit (HOSPITAL_COMMUNITY)
Admission: RE | Admit: 2022-08-07 | Discharge: 2022-08-07 | Disposition: A | Discharge status: Home / Self Care | Payer: BC Managed Care – PPO | Source: Ambulatory Visit | Attending: Cardiology | Admitting: Cardiology

## 2022-08-07 DIAGNOSIS — I5022 Chronic systolic (congestive) heart failure: Secondary | ICD-10-CM | POA: Diagnosis not present

## 2022-08-07 LAB — BASIC METABOLIC PANEL
Anion gap: 13 (ref 5–15)
BUN: 27 mg/dL — ABNORMAL HIGH (ref 6–20)
CO2: 26 mmol/L (ref 22–32)
Calcium: 9.6 mg/dL (ref 8.9–10.3)
Chloride: 101 mmol/L (ref 98–111)
Creatinine, Ser: 1.63 mg/dL — ABNORMAL HIGH (ref 0.61–1.24)
GFR, Estimated: 49 mL/min — ABNORMAL LOW (ref >60)
Glucose, Bld: 98 mg/dL (ref 70–99)
Potassium: 4.3 mmol/L (ref 3.5–5.1)
Sodium: 140 mmol/L (ref 135–145)

## 2022-08-08 ENCOUNTER — Telehealth (HOSPITAL_COMMUNITY): Payer: Self-pay | Admitting: *Deleted

## 2022-08-08 MED ORDER — FUROSEMIDE 20 MG PO TABS: 20.0000 mg | ORAL_TABLET | Freq: Every day | ORAL | 11 refills | Status: DC

## 2022-08-08 NOTE — Telephone Encounter (Signed)
Called patient's SO per Unice Cobble, PA. Based on lab results from yesterday, patient appears dry.   He is currently taking Lasix 20 mg twice daily where as previously he was taking once daily. Instructed her to have patient decrease lasix back to 20 mg daily with re-check of labs next visit. She verbalized understanding of same.

## 2022-08-20 ENCOUNTER — Ambulatory Visit (HOSPITAL_COMMUNITY)
Admission: RE | Admit: 2022-08-20 | Discharge: 2022-08-20 | Disposition: A | Discharge status: Home / Self Care | Payer: BC Managed Care – PPO | Source: Ambulatory Visit | Attending: Family Medicine | Admitting: Family Medicine

## 2022-08-20 ENCOUNTER — Encounter (HOSPITAL_COMMUNITY): Payer: Self-pay

## 2022-08-20 VITALS — BP 98/60 | HR 92 | Wt 204.0 lb

## 2022-08-20 DIAGNOSIS — I5022 Chronic systolic (congestive) heart failure: Secondary | ICD-10-CM

## 2022-08-20 DIAGNOSIS — D649 Anemia, unspecified: Secondary | ICD-10-CM | POA: Insufficient documentation

## 2022-08-20 DIAGNOSIS — Z79899 Other long term (current) drug therapy: Secondary | ICD-10-CM | POA: Diagnosis not present

## 2022-08-20 DIAGNOSIS — I10 Essential (primary) hypertension: Secondary | ICD-10-CM

## 2022-08-20 DIAGNOSIS — I428 Other cardiomyopathies: Secondary | ICD-10-CM | POA: Diagnosis not present

## 2022-08-20 DIAGNOSIS — Z8679 Personal history of other diseases of the circulatory system: Secondary | ICD-10-CM | POA: Insufficient documentation

## 2022-08-20 DIAGNOSIS — D509 Iron deficiency anemia, unspecified: Secondary | ICD-10-CM | POA: Diagnosis not present

## 2022-08-20 DIAGNOSIS — F1010 Alcohol abuse, uncomplicated: Secondary | ICD-10-CM | POA: Diagnosis not present

## 2022-08-20 DIAGNOSIS — I447 Left bundle-branch block, unspecified: Secondary | ICD-10-CM | POA: Insufficient documentation

## 2022-08-20 DIAGNOSIS — E785 Hyperlipidemia, unspecified: Secondary | ICD-10-CM | POA: Diagnosis not present

## 2022-08-20 DIAGNOSIS — I110 Hypertensive heart disease with heart failure: Secondary | ICD-10-CM | POA: Insufficient documentation

## 2022-08-20 LAB — BASIC METABOLIC PANEL
Anion gap: 11 (ref 5–15)
BUN: 31 mg/dL — ABNORMAL HIGH (ref 6–20)
CO2: 23 mmol/L (ref 22–32)
Calcium: 9 mg/dL (ref 8.9–10.3)
Chloride: 101 mmol/L (ref 98–111)
Creatinine, Ser: 1.57 mg/dL — ABNORMAL HIGH (ref 0.61–1.24)
GFR, Estimated: 51 mL/min — ABNORMAL LOW (ref >60)
Glucose, Bld: 135 mg/dL — ABNORMAL HIGH (ref 70–99)
Potassium: 3.7 mmol/L (ref 3.5–5.1)
Sodium: 135 mmol/L (ref 135–145)

## 2022-08-20 LAB — BRAIN NATRIURETIC PEPTIDE: B Natriuretic Peptide: 1324.6 pg/mL — ABNORMAL HIGH (ref 0.0–100.0)

## 2022-08-20 LAB — MAGNESIUM: Magnesium: 2 mg/dL (ref 1.7–2.4)

## 2022-08-20 NOTE — Progress Notes (Signed)
Patient ID: Brendan Rogers, male   DOB: Sep 23, 1965, 56 y.o.   MRN: [id number]     Advanced Heart Failure Clinic Note   PCP: Carilyn Goodpasture, NP Primary Cardiologist: Previously Dr Antoine Poche but hasn't followed up in > 2 years HF Cardiologist: Dr. Gala Romney   HPI:  Brendan Rogers is a 56 y.o. male with hx of NICM, HTN, HLD, and alcohol abuse.  Pt reported EF 10% in June 2006, cath with insignificant CAD. Increased to 55-60% in 2008 by echo with medical management.  After he improved, he stopped all medicine except for baby ASA and fish oil.  Admitted 4/17 with volume overload. EF back down in setting of medical noncompliance and ETOH abuse.  EF 20-25%  Echo 11/19 EF read as 30-35%. Dr. Gala Romney felt more like 40-45%. Digoxin stopped.  Echo 08/04/19 EF 25-30%. RV normal.   cMRI 7/21 1. Moderately dilated LV with diffuse hypokinesis worse in the septum, EF 25%. 2. Normal RV size with mild to moderately decreased systolic function, EF 35%. 3. There was not extensive LGE on delayed enhancement imaging. However, there was a small area of mid-wall LGE in the apical inferior wall. This is not a coronary disease pattern, possible evidence for prior myocarditis.  Echo 07/03/21  EF 20-25% RV normal.   Follow up 9/23, NYHA I and volume stable. Drinking 2 shots vodka/day. Started on iron without benefit, for IDA.  Today he returns for acute visit with c/o of recent orthopnea and abdominal swelling. Called office and instructed to increase Lasix to 20 mg bid. Repeat labs showed increase in SCr so advised he decrease lasix back to 20 mg daily. Overall feeling fine. He is not SOB walking on flat ground or up stairs. He felt better on short increase of Lasix. Denies palpitations, CP, dizziness, edema, or PND/Orthopnea. Appetite ok. No fever or chills. Weight at home 196 pounds. Taking all medications. Drinking 3 shots of vodka/day. Swims once a week and works out 3-4 days/week at [employer].  Cardiac  Studies: - Echo (10/22): EF 20-25%, RV normal  - cMRI (7/21): LVEF 25%, RVEF 35%, small mid-wall LGE in apical interior wall, possible prior myocarditis.  - Echo (11/20): EF 25-30%, RV normal  - Echo (11/19): EF 30-35%, Dr. Gala Romney felt more like 40-45%  - CPX 01/13/16 FVC 2.85 (75%)      FEV1 2.38 (78%)        FEV1/FVC 83 (104%)        MVV 121 (85%)  Exercise Time: 16:43  Speed (mph): 4.0  Grade (%): 16.5     RPE: 17 Reason stopped: Patient ended test due to overall fatigue. Additional symptoms: Dyspnea (7/10) Resting HR: 101 Peak HR: 170   (100% age predicted max HR) BP rest: 102/78 BP peak: 160/76 Peak VO2: 30.7 (104% predicted peak VO2) VE/VCO2 slope:  31 OUES: 3.10 Peak RER: 1.06 Ventilatory Threshold: 18.9   (64% predicted or measured peak VO2) Peak RR 45 Peak Ventilation:  84.8 VE/MVV:  95% PETCO2 at peak:  38 O2pulse:  17   (106% predicted O2pulse)  Review of systems complete and found to be negative unless listed in HPI.    Past Medical History:  Diagnosis Date   CHF (congestive heart failure) (HCC)    secondary to nonischemic cardiomyopathy, EF normal by echcardiogram   Dyslipidemia    History of ETOH abuse    Hypertension    Current Outpatient Medications  Medication Sig Dispense Refill   carvedilol (COREG) 25  MG tablet Take 1 tablet (25 mg total) by mouth 2 (two) times daily with a meal. 180 tablet 3   cetirizine (ZYRTEC) 10 MG tablet Take 10 mg by mouth daily.     ENTRESTO 97-103 MG Take 1 tablet by mouth twice daily 180 tablet 3   FARXIGA 10 MG TABS tablet TAKE 1 TABLET BY MOUTH ONCE DAILY BEFORE BREAKFAST 30 tablet 11   FEROSUL 325 (65 Fe) MG tablet Take 325 mg by mouth daily.     furosemide (LASIX) 20 MG tablet Take 1 tablet (20 mg total) by mouth daily. 60 tablet 11   multivitamin (THERAGRAN) per tablet Take 1 tablet by mouth at bedtime.     Omega-3 Fatty Acids (FISH OIL) 300 MG CAPS Take 1 capsule by mouth at bedtime.     spironolactone  (ALDACTONE) 25 MG tablet Take 1 tablet (25 mg total) by mouth daily. 30 tablet 3   potassium chloride SA (K-DUR) 20 MEQ tablet Take 1 tablet (20 mEq total) by mouth as needed. (Patient not taking: Reported on 06/06/2022) 90 tablet 3   No current facility-administered medications for this encounter.   No Known Allergies  Social History   Socioeconomic History   Marital status: Divorced    Spouse name: Not on file   Number of children: Not on file   Years of education: Not on file   Highest education level: Not on file  Occupational History   Not on file  Tobacco Use   Smoking status: Never   Smokeless tobacco: Never  Substance and Sexual Activity   Alcohol use: Yes    Comment: regularly, including 3 shots a day   Drug use: Not on file   Sexual activity: Not on file  Other Topics Concern   Not on file  Social History Narrative   Not on file   Social Determinants of Health   Financial Resource Strain: Not on file  Food Insecurity: Not on file  Transportation Needs: Not on file  Physical Activity: Not on file  Stress: Not on file  Social Connections: Not on file  Intimate Partner Violence: Not on file   Family History  Problem Relation Age of Onset   Coronary artery disease Other    BP 98/60   Pulse 92   Wt 92.5 kg (204 lb)   SpO2 98%   BMI 31.95 kg/m   Wt Readings from Last 3 Encounters:  08/20/22 92.5 kg (204 lb)  06/06/22 94.1 kg (207 lb 6.4 oz)  07/03/21 95.6 kg (210 lb 12.8 oz)    PHYSICAL EXAM: General:  NAD. No resp difficulty HEENT: Normal Neck: Supple. No JVD. Carotids 2+ bilat; no bruits. No lymphadenopathy or thryomegaly appreciated. Cor: PMI nondisplaced. Regular rate & rhythm. No rubs, gallops or murmurs. Lungs: Clear Abdomen: Soft, nontender, nondistended. No hepatosplenomegaly. No bruits or masses. Good bowel sounds. Extremities: No cyanosis, clubbing, rash, edema Neuro: Alert & oriented x 3, cranial nerves grossly intact. Moves all 4  extremities w/o difficulty. Affect pleasant.  ReDs: 41%  ASSESSMENT & PLAN: 1. Chronic Systolic CHF, due to NICM  - Echo (4/17): EF 20-25%. Echo EF 30-35% in Dec. 2017.  - Echo (10/18): EF 40-45%, improved from previous.  - Echo (11/19): EF read as 30-35% (I felt closer to 40-45%) - Echo (11/20): EF 25-30%. RV normal. - cMRI (7/21): LVEF 25%, RVEF 35%, mild LGE ? Previous myocarditis - Echo (10/22): EF 20-25% RV normal.  - Stable NYHA I symptoms despite persistent severe  LV dysfunction. - LBBB is new, so doubt cause of cardiomyopathy - Volume status stable, weight down 3 lbs. ReDs 41%. Discordant with exam findings. - Continue Lasix 20 mg daily. OK to take extra 20 mg PRN. - Continue Entresto 97/103 mg bid.  - Continue carvedilol 25 mg bid. - Continue spironolactone 25 mg daily. - Continue Farxiga 10 mg daily. - Consider Bidil down the road as BP tolerates. - No ICD with NYHA I symptoms.  - Discussed need to stop drinking ETOH. as it may be causing his cardiomyopathy - Schedule echo, if EF still down and LBBB persists, can consider CRT. - Labs today.  2. HTN - Blood pressure well controlled.  - Continue current regimen.  3. ETOH abuse - Again encouraged complete abstinence  - Check mag  4. Fe- deficient anemia - Has GI follow up 11/2022  Follow up with Dr. Gala Romney as scheduled.   Jacklynn Ganong, FNP  3:47 PM

## 2022-08-20 NOTE — Patient Instructions (Addendum)
Thank you for coming in today  Labs were done today, if any labs are abnormal the clinic will call you No news is good news  Your physician recommends that you schedule a follow-up appointment in: schedule a appointment for the week of 12/01/21 with Dr. Gala Romney  Your physician has requested that you have an echocardiogram. Echocardiography is a painless test that uses sound waves to create images of your heart. It provides your doctor with information about the size and shape of your heart and how well your heart's chambers and valves are working. This procedure takes approximately one hour. There are no restrictions for this procedure.     Do the following things EVERYDAY: Weigh yourself in the morning before breakfast. Write it down and keep it in a log. Take your medicines as prescribed Eat low salt foods--Limit salt (sodium) to 2000 mg per day.  Stay as active as you can everyday Limit all fluids for the day to less than 2 liters  At the [facility], you and your health needs are our priority. As part of our continuing mission to provide you with exceptional heart care, we have created designated Provider Care Teams. These Care Teams include your primary Cardiologist (physician) and Advanced Practice Providers (APPs- Physician Assistants and Nurse Practitioners) who all work together to provide you with the care you need, when you need it.   You may see any of the following providers on your designated Care Team at your next follow up: Dr Arvilla Meres Dr Marca Ancona Dr. Marcos Eke, NP Robbie Lis, [place] [facility] [place], [place] Brynda Peon, NP Karle Plumber, PharmD   Please be sure to bring in all your medications bottles to every appointment.   If you have any questions or concerns before your next appointment please send [place] a message through [place] or call our office at [phone].    TO LEAVE A MESSAGE FOR THE NURSE  SELECT OPTION 2, PLEASE LEAVE A MESSAGE INCLUDING: YOUR NAME DATE OF BIRTH CALL BACK NUMBER REASON FOR CALL**this is important as we prioritize the call backs  YOU WILL RECEIVE A CALL BACK THE SAME DAY AS LONG AS YOU CALL BEFORE 4:00 PM

## 2022-08-21 ENCOUNTER — Telehealth (HOSPITAL_COMMUNITY): Payer: Self-pay | Admitting: Surgery

## 2022-08-21 DIAGNOSIS — I5022 Chronic systolic (congestive) heart failure: Secondary | ICD-10-CM

## 2022-08-21 MED ORDER — POTASSIUM CHLORIDE CRYS ER 20 MEQ PO TBCR: 40.0000 meq | EXTENDED_RELEASE_TABLET | Freq: Every day | ORAL | 6 refills | Status: DC

## 2022-08-21 MED ORDER — FUROSEMIDE 20 MG PO TABS: 40.0000 mg | ORAL_TABLET | Freq: Every day | ORAL | 11 refills | Status: AC

## 2022-08-21 NOTE — Telephone Encounter (Signed)
-----   Message from Jacklynn Ganong, [place] sent at 08/21/2022  2:29 PM EST ----- BNP elevated.  Increase Lasix to 40 mg daily and increase KCL to 40 daily.  Repeat BMEt in 10-14 days.

## 2022-08-21 NOTE — Telephone Encounter (Signed)
I called and delivered message to fiancee.  I will update medlist in [facility] and appt for labwork recheck scheduled when they were available to return.

## 2022-09-04 ENCOUNTER — Ambulatory Visit (HOSPITAL_COMMUNITY)
Admission: RE | Admit: 2022-09-04 | Discharge: 2022-09-04 | Disposition: A | Discharge status: Home / Self Care | Payer: BC Managed Care – PPO | Source: Ambulatory Visit | Attending: Internal Medicine | Admitting: Internal Medicine

## 2022-09-04 ENCOUNTER — Other Ambulatory Visit (HOSPITAL_COMMUNITY): Payer: BC Managed Care – PPO

## 2022-09-04 DIAGNOSIS — I5022 Chronic systolic (congestive) heart failure: Secondary | ICD-10-CM | POA: Diagnosis not present

## 2022-09-04 LAB — BASIC METABOLIC PANEL
Anion gap: 9 (ref 5–15)
BUN: 21 mg/dL — ABNORMAL HIGH (ref 6–20)
CO2: 24 mmol/L (ref 22–32)
Calcium: 9.4 mg/dL (ref 8.9–10.3)
Chloride: 105 mmol/L (ref 98–111)
Creatinine, Ser: 1.27 mg/dL — ABNORMAL HIGH (ref 0.61–1.24)
GFR, Estimated: >60 mL/min (ref >60)
Glucose, Bld: 76 mg/dL (ref 70–99)
Potassium: 4 mmol/L (ref 3.5–5.1)
Sodium: 138 mmol/L (ref 135–145)

## 2022-09-24 ENCOUNTER — Other Ambulatory Visit (HOSPITAL_COMMUNITY): Payer: Self-pay | Admitting: Internal Medicine

## 2022-09-24 ENCOUNTER — Other Ambulatory Visit (HOSPITAL_COMMUNITY): Payer: Self-pay | Admitting: *Deleted

## 2022-09-24 MED ORDER — ENTRESTO 97-103 MG PO TABS: 1.0000 | ORAL_TABLET | Freq: Two times a day (BID) | ORAL | 6 refills | Status: AC

## 2022-11-12 ENCOUNTER — Ambulatory Visit (INDEPENDENT_AMBULATORY_CARE_PROVIDER_SITE_OTHER): Payer: BC Managed Care – PPO | Admitting: Gastroenterology

## 2022-11-12 ENCOUNTER — Telehealth (INDEPENDENT_AMBULATORY_CARE_PROVIDER_SITE_OTHER): Payer: Self-pay | Admitting: Gastroenterology

## 2022-11-12 ENCOUNTER — Encounter (INDEPENDENT_AMBULATORY_CARE_PROVIDER_SITE_OTHER): Payer: Self-pay | Admitting: Gastroenterology

## 2022-11-12 VITALS — BP 100/69 | HR 90 | Temp 97.3°F | Ht 67.0 in | Wt 210.2 lb

## 2022-11-12 DIAGNOSIS — K529 Noninfective gastroenteritis and colitis, unspecified: Secondary | ICD-10-CM | POA: Diagnosis not present

## 2022-11-12 NOTE — Telephone Encounter (Signed)
Attempted to contact pt to schedule colonoscopy. No answer and voicemail full. Will call back tomorrow 11/13/22

## 2022-11-12 NOTE — Telephone Encounter (Signed)
Pt fiance [name] [phone]) returned call. Pt would like April 2 in the afternoon. Advised [name] that we would have to call them back when we get April schedule.

## 2022-11-12 NOTE — Progress Notes (Signed)
Katrinka Blazing, M.D. Gastroenterology & Hepatology [facility] [address] [place], [place] [address] Primary Care Physician: Carilyn Goodpasture, NP [address] [place] [address]  Referring MD: PCP  Chief Complaint: Diarrhea  History of Present Illness: Brendan Rogers is a 57 y.o. male with PMH HLD, HFdEF 20-25%, HLD, HTN, who presents for evaluation of diarrhea.  Patient comes to the office with his wife.  Patient reports that he has presented issues with diarrhea for the last 2 years. He has at least 2 episodes of watery diarrhea per week ,without melena or hematochezia.  Denies seeing any mucus. He has bloating when he eats certain foods, for example with steak or pizza.  He has tried to be careful with this to avoid issues with heartburn. May take Tums if his symptoms are significant. Steak may actually make him constipated. Has not been eating pork for many years.  No medications were added to his regimen when he started developing the intermittent bouts of diarrhea.  The patient denies having any nausea, vomiting, fever, chills, hematochezia, melena, hematemesis, abdominal distention, abdominal pain, diarrhea, jaundice, pruritus. Has lost weight by changing his intake of salt given his heart failure.  Most recent labs from 01/29/2022 showed CMP BUN 30 Cr 1.2 glucose 112 Na 139 K 4.3 AST 19 ALT 22 alk phos 36 TB 0.4, CBC WBC 6.5 Hb 12.3 PLT 218  Last BJY:NWGNF Last Colonoscopy:never  FHx: neg for any gastrointestinal/liver disease, father prostate cancer in his 46s, uncle colon cancer Social: neg smoking, or illicit drug use. Drinks Smirnoff 3 shots every day. Surgical: no abdominal surgeries  Past Medical History: Past Medical History:  Diagnosis Date   CHF (congestive heart failure) (HCC)    secondary to nonischemic cardiomyopathy, EF normal by echcardiogram   Dyslipidemia    History of ETOH abuse    Hypertension      Past Surgical History:History reviewed. No pertinent surgical history.  Family History: Family History  Problem Relation Age of Onset   Coronary artery disease Other     Social History: Social History   Tobacco Use  Smoking Status Never  Smokeless Tobacco Never   Social History   Substance and Sexual Activity  Alcohol Use Yes   Comment: regularly, including 3 shots a day   Social History   Substance and Sexual Activity  Drug Use Not on file    Allergies: No Known Allergies  Medications: Current Outpatient Medications  Medication Sig Dispense Refill   carvedilol (COREG) 25 MG tablet Take 1 tablet (25 mg total) by mouth 2 (two) times daily with a meal. 180 tablet 3   cetirizine (ZYRTEC) 10 MG tablet Take 10 mg by mouth daily.     FARXIGA 10 MG TABS tablet TAKE 1 TABLET BY MOUTH ONCE DAILY BEFORE BREAKFAST 30 tablet 11   FEROSUL 325 (65 Fe) MG tablet Take 325 mg by mouth daily.     furosemide (LASIX) 20 MG tablet Take 2 tablets (40 mg total) by mouth daily. 60 tablet 11   multivitamin (THERAGRAN) per tablet Take 1 tablet by mouth at bedtime.     Omega-3 Fatty Acids (FISH OIL) 300 MG CAPS Take 1 capsule by mouth at bedtime.     potassium chloride SA (KLOR-CON M) 20 MEQ tablet Take 2 tablets (40 mEq total) by mouth daily. 60 tablet 6   sacubitril-valsartan (ENTRESTO) 97-103 MG Take 1 tablet by mouth 2 (two) times daily. 60 tablet 6  spironolactone (ALDACTONE) 25 MG tablet Take 1 tablet (25 mg total) by mouth daily. 30 tablet 3   No current facility-administered medications for this visit.    Review of Systems: GENERAL: negative for malaise, night sweats HEENT: No changes in hearing or vision, no nose bleeds or other nasal problems. NECK: Negative for lumps, goiter, pain and significant neck swelling RESPIRATORY: Negative for cough, wheezing CARDIOVASCULAR: Negative for chest pain, leg swelling, palpitations, orthopnea GI: SEE HPI MUSCULOSKELETAL: Negative for  joint pain or swelling, back pain, and muscle pain. SKIN: Negative for lesions, rash PSYCH: Negative for sleep disturbance, mood disorder and recent psychosocial stressors. HEMATOLOGY Negative for prolonged bleeding, bruising easily, and swollen nodes. ENDOCRINE: Negative for cold or heat intolerance, polyuria, polydipsia and goiter. NEURO: negative for tremor, gait imbalance, syncope and seizures. The remainder of the review of systems is noncontributory.   Physical Exam: BP 100/69 (BP Location: Left Arm, Patient Position: Sitting, Cuff Size: Large)   Pulse 90   Temp (!) 97.3 F (36.3 C) (Temporal)   Ht 5\' 7"  (1.702 m)   Wt 210 lb 3.2 oz (95.3 kg)   BMI 32.92 kg/m  GENERAL: The patient is AO x3, in no acute distress. HEENT: Head is normocephalic and atraumatic. EOMI are intact. Mouth is well hydrated and without lesions. NECK: Supple. No masses LUNGS: Clear to auscultation. No presence of rhonchi/wheezing/rales. Adequate chest expansion HEART: RRR, normal s1 and s2. ABDOMEN: Soft, nontender, no guarding, no peritoneal signs, and nondistended. BS +. No masses. EXTREMITIES: Without any cyanosis, clubbing, rash, lesions or edema. NEUROLOGIC: AOx3, no focal motor deficit. SKIN: no jaundice, no rashes   Imaging/Labs: as above  I personally reviewed and interpreted the available labs, imaging and endoscopic files.  Impression and Plan: Brendan Rogers is a 57 y.o. male with PMH HLD, HFdEF 20-25%, HLD, HTN, who presents for evaluation of diarrhea.  The patient has presented intermittent occasional episodes of diarrhea without any red flag signs.  These are usually self-limited and has not presented any fecal soiling or fecal urgency.  Will check for celiac disease and alpha gal with serology today.  It seems that he has identified some potential triggers in his diet which he should avoid is much as possible.  He is due for colorectal cancer screening, for which he will proceed with a  colonoscopy.  If still presenting diarrhea and blood workup is negative, can consider performing random colonic biopsies at that time.  -Schedule colonoscopy -Check celiac disease panel and alpha gal  All questions were answered.      Katrinka Blazing, MD Gastroenterology and Hepatology [facility]

## 2022-11-12 NOTE — Patient Instructions (Signed)
Schedule colonoscopy Perform blood workup

## 2022-11-12 NOTE — Telephone Encounter (Signed)
error 

## 2022-11-13 MED ORDER — PEG 3350-KCL-NA BICARB-NACL 420 G PO SOLR: 4000.0000 mL | Freq: Once | ORAL | 0 refills | Status: AC

## 2022-11-13 NOTE — Telephone Encounter (Signed)
Contact Brendan Rogers and scheduled pt for TCS on 12/11/22. Prep sent to pharmacy. Instructions will be mailed to patient. Hold Farxiga x 3 day, hold iron x 7 days. Will call pt with pre op appt.

## 2022-11-13 NOTE — Addendum Note (Signed)
Addended by: Vicente Males on: 11/13/2022 10:31 AM   Modules accepted: Orders

## 2022-11-14 NOTE — Addendum Note (Signed)
Addended by: Harvel Quale on: 11/14/2022 04:05 PM   Modules accepted: Level of Service

## 2022-11-21 ENCOUNTER — Telehealth (INDEPENDENT_AMBULATORY_CARE_PROVIDER_SITE_OTHER): Payer: Self-pay | Admitting: Gastroenterology

## 2022-11-21 NOTE — Telephone Encounter (Signed)
Thanks for the update

## 2022-11-21 NOTE — Telephone Encounter (Signed)
[name] (pt girlfriend) left message needing to cancel TCS for 12/11/22.  Contacted [name] and she states pt is unable to get time off work for procedure. They will call back when ready to reschedule.

## 2022-11-22 ENCOUNTER — Telehealth: Payer: Self-pay | Admitting: *Deleted

## 2022-11-22 NOTE — Telephone Encounter (Signed)
Order ID: [id number]       Authorized  Approval Valid Through: 11/22/2022 - [id number]  Echo pre cert

## 2022-11-26 ENCOUNTER — Encounter (HOSPITAL_COMMUNITY): Payer: Self-pay | Admitting: Cardiology

## 2022-11-26 ENCOUNTER — Ambulatory Visit (HOSPITAL_COMMUNITY)
Admission: RE | Admit: 2022-11-26 | Discharge: 2022-11-26 | Disposition: A | Discharge status: Home / Self Care | Payer: BC Managed Care – PPO | Source: Ambulatory Visit | Attending: Internal Medicine | Admitting: Internal Medicine

## 2022-11-26 DIAGNOSIS — I110 Hypertensive heart disease with heart failure: Secondary | ICD-10-CM | POA: Diagnosis not present

## 2022-11-26 DIAGNOSIS — I517 Cardiomegaly: Secondary | ICD-10-CM | POA: Diagnosis not present

## 2022-11-26 DIAGNOSIS — I5022 Chronic systolic (congestive) heart failure: Secondary | ICD-10-CM | POA: Diagnosis not present

## 2022-11-26 DIAGNOSIS — E785 Hyperlipidemia, unspecified: Secondary | ICD-10-CM | POA: Insufficient documentation

## 2022-11-26 DIAGNOSIS — I340 Nonrheumatic mitral (valve) insufficiency: Secondary | ICD-10-CM | POA: Diagnosis not present

## 2022-11-26 DIAGNOSIS — I5030 Unspecified diastolic (congestive) heart failure: Secondary | ICD-10-CM | POA: Diagnosis not present

## 2022-11-26 LAB — ECHOCARDIOGRAM COMPLETE
AR max vel: 4.63 cm2
AV Area VTI: 3.94 cm2
AV Area mean vel: 4.19 cm2
AV Mean grad: 3 mmHg
AV Peak grad: 6.1 mmHg
Ao pk vel: 1.23 m/s
Area-P 1/2: 4.68 cm2
Calc EF: 39.3 %
MV M vel: 4.56 m/s
MV Peak grad: 83.2 mmHg
S' Lateral: 6 cm
Single Plane A2C EF: 37.8 %
Single Plane A4C EF: 33.1 %

## 2022-12-06 ENCOUNTER — Other Ambulatory Visit (HOSPITAL_COMMUNITY): Payer: Self-pay | Admitting: Internal Medicine

## 2022-12-11 ENCOUNTER — Ambulatory Visit (HOSPITAL_COMMUNITY): Admit: 2022-12-11 | Payer: BC Managed Care – PPO | Admitting: Gastroenterology

## 2022-12-11 ENCOUNTER — Encounter (HOSPITAL_COMMUNITY): Payer: Self-pay

## 2022-12-11 SURGERY — COLONOSCOPY WITH PROPOFOL
Anesthesia: Monitor Anesthesia Care

## 2023-01-06 ENCOUNTER — Other Ambulatory Visit (HOSPITAL_COMMUNITY): Payer: Self-pay | Admitting: Physician Assistant

## 2023-01-21 NOTE — Progress Notes (Signed)
Patient ID: Brendan Rogers, male   DOB: 1966/08/11, 57 y.o.   MRN: [id number]     [facility] Note   PCP: Carilyn Goodpasture, NP Primary Cardiologist: Previously Dr Antoine Poche but hasn't followed up in > 2 years HF Cardiologist: Dr. Gala Romney   HPI:  [name] is a 57 y.o. male with hx of NICM, HTN, HLD, and alcohol abuse.  Pt reported EF 10% in June 2006, cath with insignificant CAD. Increased to 55-60% in 2008 by echo with medical management.  After he improved, he stopped all medicine except for baby ASA and fish oil.  Admitted 4/17 with volume overload. EF back down in setting of medical noncompliance and ETOH abuse.  EF 20-25%  Echo 11/19 EF read as 30-35%. Dr. Gala Romney felt more like 40-45%. Digoxin stopped.  Echo 08/04/19 EF 25-30%. RV normal.   cMRI 7/21 1. Moderately dilated LV with diffuse hypokinesis worse in the septum, EF 25%. 2. Normal RV size with mild to moderately decreased systolic function, EF 35%. 3. There was not extensive LGE on delayed enhancement imaging. However, there was a small area of mid-wall LGE in the apical inferior wall. This is not a coronary disease pattern, possible evidence for prior myocarditis.  Echo 07/03/21  EF 20-25% RV normal.  Echo 11/26/22 EF 20-25% severely dilated RV mild HK   Today he returns for acute visit with c/o of recent orthopnea and abdominal swelling. Called office and instructed to increase Lasix to 20 mg bid. Repeat labs showed increase in SCr so advised he decrease lasix back to 20 mg daily. Overall feeling fine. He is not SOB walking on flat ground or up stairs. He felt better on short increase of Lasix. Denies palpitations, CP, dizziness, edema, or PND/Orthopnea. Appetite ok. No fever or chills. Weight at home 196 pounds. Taking all medications. Drinking 3 shots of vodka/day. Swims once a week and works out 3-4 days/week at [employer].  Cardiac Studies: - Echo (10/22): EF 20-25%, RV normal  - cMRI  (7/21): LVEF 25%, RVEF 35%, small mid-wall LGE in apical interior wall, possible prior myocarditis.  - Echo (11/20): EF 25-30%, RV normal  - Echo (11/19): EF 30-35%, Dr. Gala Romney felt more like 40-45%  - CPX 01/13/16 FVC 2.85 (75%)      FEV1 2.38 (78%)        FEV1/FVC 83 (104%)        MVV 121 (85%)  Exercise Time: 16:43  Speed (mph): 4.0  Grade (%): 16.5     RPE: 17 Reason stopped: Patient ended test due to overall fatigue. Additional symptoms: Dyspnea (7/10) Resting HR: 101 Peak HR: 170   (100% age predicted max HR) BP rest: 102/78 BP peak: 160/76 Peak VO2: 30.7 (104% predicted peak VO2) VE/VCO2 slope:  31 OUES: 3.10 Peak RER: 1.06 Ventilatory Threshold: 18.9   (64% predicted or measured peak VO2) Peak RR 45 Peak Ventilation:  84.8 VE/MVV:  95% PETCO2 at peak:  38 O2pulse:  17   (106% predicted O2pulse)  Review of systems complete and found to be negative unless listed in HPI.    Past Medical History:  Diagnosis Date   CHF (congestive heart failure) (HCC)    secondary to nonischemic cardiomyopathy, EF normal by echcardiogram   Dyslipidemia    History of ETOH abuse    Hypertension    Current Outpatient Medications  Medication Sig Dispense Refill   carvedilol (COREG) 25 MG tablet Take 1 tablet (25 mg total) by mouth  2 (two) times daily with a meal. 180 tablet 3   cetirizine (ZYRTEC) 10 MG tablet Take 10 mg by mouth daily.     dapagliflozin propanediol (FARXIGA) 10 MG TABS tablet TAKE 1 TABLET BY MOUTH ONCE DAILY BEFORE BREAKFAST 30 tablet 6   FEROSUL 325 (65 Fe) MG tablet Take 325 mg by mouth daily.     furosemide (LASIX) 20 MG tablet Take 2 tablets (40 mg total) by mouth daily. 60 tablet 11   multivitamin (THERAGRAN) per tablet Take 1 tablet by mouth at bedtime.     Omega-3 Fatty Acids (FISH OIL) 300 MG CAPS Take 1 capsule by mouth at bedtime.     potassium chloride SA (KLOR-CON M) 20 MEQ tablet Take 2 tablets (40 mEq total) by mouth daily. 60 tablet 6    sacubitril-valsartan (ENTRESTO) 97-103 MG Take 1 tablet by mouth 2 (two) times daily. 60 tablet 6   spironolactone (ALDACTONE) 25 MG tablet Take 1 tablet (25 mg total) by mouth daily. NEEDS FOLLOW UP APPOINTMENT FOR ANYMORE REFILLS 30 tablet 0   No current facility-administered medications for this encounter.   No Known Allergies  Social History   Socioeconomic History   Marital status: Divorced    Spouse name: Not on file   Number of children: Not on file   Years of education: Not on file   Highest education level: Not on file  Occupational History   Not on file  Tobacco Use   Smoking status: Never   Smokeless tobacco: Never  Vaping Use   Vaping Use: Never used  Substance and Sexual Activity   Alcohol use: Yes    Comment: regularly, including 3 shots a day   Drug use: Not on file   Sexual activity: Not on file  Other Topics Concern   Not on file  Social History Narrative   Not on file   Social Determinants of Health   Financial Resource Strain: Not on file  Food Insecurity: Not on file  Transportation Needs: Not on file  Physical Activity: Not on file  Stress: Not on file  Social Connections: Not on file  Intimate Partner Violence: Not on file   Family History  Problem Relation Age of Onset   Coronary artery disease Other    There were no vitals taken for this visit.  Wt Readings from Last 3 Encounters:  11/12/22 95.3 kg (210 lb 3.2 oz)  08/20/22 92.5 kg (204 lb)  06/06/22 94.1 kg (207 lb 6.4 oz)    PHYSICAL EXAM: General:  NAD. No resp difficulty HEENT: Normal Neck: Supple. No JVD. Carotids 2+ bilat; no bruits. No lymphadenopathy or thryomegaly appreciated. Cor: PMI nondisplaced. Regular rate & rhythm. No rubs, gallops or murmurs. Lungs: Clear Abdomen: Soft, nontender, nondistended. No hepatosplenomegaly. No bruits or masses. Good bowel sounds. Extremities: No cyanosis, clubbing, rash, edema Neuro: Alert & oriented x 3, cranial nerves grossly intact.  Moves all 4 extremities w/o difficulty. Affect pleasant.  ReDs: 41%  ASSESSMENT & PLAN: 1. Chronic Systolic CHF, due to NICM  - Echo (4/17): EF 20-25%. Echo EF 30-35% in Dec. 2017.  - Echo (10/18): EF 40-45%, improved from previous.  - Echo (11/19): EF read as 30-35% (I felt closer to 40-45%) - Echo (11/20): EF 25-30%. RV normal. - cMRI (7/21): LVEF 25%, RVEF 35%, mild LGE ? Previous myocarditis - Echo (10/22): EF 20-25% RV normal.  - Echo 11/26/22 EF 20-25% severely dilated RV mild HK - Stable NYHA I symptoms despite persistent severe  LV dysfunction. - LBBB is new, so doubt cause of cardiomyopathy - Volume status stable, weight down 3 lbs. ReDs 41%. Discordant with exam findings. - Continue Lasix 20 mg daily. OK to take extra 20 mg PRN. - Continue Entresto 97/103 mg bid.  - Continue carvedilol 25 mg bid. - Continue spironolactone 25 mg daily. - Continue Farxiga 10 mg daily. - Consider Bidil down the road as BP tolerates. - No ICD with NYHA I symptoms.  - Discussed need to stop drinking ETOH. as it may be causing his cardiomyopathy - Labs today.  2. HTN - Blood pressure well controlled.  - Continue current regimen.  3. ETOH abuse - Again encouraged complete abstinence  - Check mag  4. Fe- deficient anemia - Has GI follow up 11/2022   Arvilla Meres, MD  1:12 PM

## 2023-01-22 ENCOUNTER — Other Ambulatory Visit (HOSPITAL_COMMUNITY): Payer: Self-pay | Admitting: Internal Medicine

## 2023-01-22 ENCOUNTER — Encounter (HOSPITAL_COMMUNITY): Payer: Self-pay | Admitting: Internal Medicine

## 2023-01-22 ENCOUNTER — Ambulatory Visit (HOSPITAL_COMMUNITY)
Admission: RE | Admit: 2023-01-22 | Discharge: 2023-01-22 | Disposition: A | Discharge status: Home / Self Care | Payer: BC Managed Care – PPO | Source: Ambulatory Visit | Attending: Internal Medicine | Admitting: Internal Medicine

## 2023-01-22 VITALS — BP 120/82 | HR 84 | Wt 212.8 lb

## 2023-01-22 DIAGNOSIS — Z7984 Long term (current) use of oral hypoglycemic drugs: Secondary | ICD-10-CM | POA: Insufficient documentation

## 2023-01-22 DIAGNOSIS — R42 Dizziness and giddiness: Secondary | ICD-10-CM | POA: Insufficient documentation

## 2023-01-22 DIAGNOSIS — I428 Other cardiomyopathies: Secondary | ICD-10-CM | POA: Insufficient documentation

## 2023-01-22 DIAGNOSIS — I10 Essential (primary) hypertension: Secondary | ICD-10-CM | POA: Diagnosis not present

## 2023-01-22 DIAGNOSIS — I5022 Chronic systolic (congestive) heart failure: Secondary | ICD-10-CM | POA: Insufficient documentation

## 2023-01-22 DIAGNOSIS — E785 Hyperlipidemia, unspecified: Secondary | ICD-10-CM | POA: Diagnosis not present

## 2023-01-22 DIAGNOSIS — Z8249 Family history of ischemic heart disease and other diseases of the circulatory system: Secondary | ICD-10-CM | POA: Insufficient documentation

## 2023-01-22 DIAGNOSIS — I110 Hypertensive heart disease with heart failure: Secondary | ICD-10-CM | POA: Diagnosis not present

## 2023-01-22 DIAGNOSIS — F1010 Alcohol abuse, uncomplicated: Secondary | ICD-10-CM | POA: Insufficient documentation

## 2023-01-22 DIAGNOSIS — Z79899 Other long term (current) drug therapy: Secondary | ICD-10-CM | POA: Diagnosis not present

## 2023-01-22 DIAGNOSIS — Z8679 Personal history of other diseases of the circulatory system: Secondary | ICD-10-CM | POA: Diagnosis not present

## 2023-01-22 LAB — BASIC METABOLIC PANEL
Anion gap: 9 (ref 5–15)
BUN: 35 mg/dL — ABNORMAL HIGH (ref 6–20)
CO2: 24 mmol/L (ref 22–32)
Calcium: 9.2 mg/dL (ref 8.9–10.3)
Chloride: 101 mmol/L (ref 98–111)
Creatinine, Ser: 1.7 mg/dL — ABNORMAL HIGH (ref 0.61–1.24)
GFR, Estimated: 46 mL/min — ABNORMAL LOW (ref >60)
Glucose, Bld: 120 mg/dL — ABNORMAL HIGH (ref 70–99)
Potassium: 4.2 mmol/L (ref 3.5–5.1)
Sodium: 134 mmol/L — ABNORMAL LOW (ref 135–145)

## 2023-01-22 LAB — BRAIN NATRIURETIC PEPTIDE: B Natriuretic Peptide: 3697 pg/mL — ABNORMAL HIGH (ref 0.0–100.0)

## 2023-01-22 NOTE — Patient Instructions (Addendum)
There has been no changes to your medications.  Labs done today, your results will be available in MyChart, we will contact you for abnormal readings.  Please go to [facility] to get the blood test drawn  Your physician recommends that you schedule a follow-up appointment in: 6 months (November) ** please call the office in August to arrange your follow up appointment.**   If you have any questions or concerns before your next appointment please send [place] a message through [place] or call our office at [phone].    TO LEAVE A MESSAGE FOR THE NURSE SELECT OPTION 2, PLEASE LEAVE A MESSAGE INCLUDING: YOUR NAME DATE OF BIRTH CALL BACK NUMBER REASON FOR CALL**this is important as we prioritize the call backs  YOU WILL RECEIVE A CALL BACK THE SAME DAY AS LONG AS YOU CALL BEFORE 4:00 PM  At the [facility], you and your health needs are our priority. As part of our continuing mission to provide you with exceptional heart care, we have created designated Provider Care Teams. These Care Teams include your primary Cardiologist (physician) and Advanced Practice Providers (APPs- Physician Assistants and Nurse Practitioners) who all work together to provide you with the care you need, when you need it.   You may see any of the following providers on your designated Care Team at your next follow up: Dr Arvilla Meres Dr Marca Ancona Dr. Marcos Eke, NP Robbie Lis, [place] [facility] [place], [place] Brynda Peon, NP Karle Plumber, PharmD   Please be sure to bring in all your medications bottles to every appointment.    Thank you for choosing [facility]

## 2023-01-22 NOTE — Progress Notes (Signed)
ReDS Vest / Clip - 01/22/23 1600       ReDS Vest / Clip   Station Marker D    Ruler Value 37    ReDS Value Range High volume overload    ReDS Actual Value 41

## 2023-01-24 ENCOUNTER — Encounter (HOSPITAL_COMMUNITY): Payer: Self-pay | Admitting: Cardiology

## 2023-02-11 DIAGNOSIS — F1010 Alcohol abuse, uncomplicated: Secondary | ICD-10-CM | POA: Diagnosis not present

## 2023-02-11 DIAGNOSIS — R7303 Prediabetes: Secondary | ICD-10-CM | POA: Diagnosis not present

## 2023-02-11 DIAGNOSIS — Z0000 Encounter for general adult medical examination without abnormal findings: Secondary | ICD-10-CM | POA: Diagnosis not present

## 2023-02-11 DIAGNOSIS — I509 Heart failure, unspecified: Secondary | ICD-10-CM | POA: Diagnosis not present

## 2023-02-11 DIAGNOSIS — I10 Essential (primary) hypertension: Secondary | ICD-10-CM | POA: Diagnosis not present

## 2023-02-17 DIAGNOSIS — Z1211 Encounter for screening for malignant neoplasm of colon: Secondary | ICD-10-CM | POA: Diagnosis not present

## 2023-02-18 DIAGNOSIS — R944 Abnormal results of kidney function studies: Secondary | ICD-10-CM | POA: Diagnosis not present

## 2023-03-05 ENCOUNTER — Other Ambulatory Visit (HOSPITAL_COMMUNITY): Payer: Self-pay | Admitting: Physician Assistant

## 2023-03-06 ENCOUNTER — Other Ambulatory Visit (HOSPITAL_COMMUNITY): Payer: Self-pay

## 2023-03-06 DIAGNOSIS — I5022 Chronic systolic (congestive) heart failure: Secondary | ICD-10-CM

## 2023-03-06 MED ORDER — SPIRONOLACTONE 25 MG PO TABS: 25.0000 mg | ORAL_TABLET | Freq: Every day | ORAL | 11 refills | Status: AC

## 2023-05-12 DEATH — deceased

## 2023-05-15 ENCOUNTER — Encounter (HOSPITAL_COMMUNITY): Payer: Self-pay | Admitting: *Deleted

## 2023-05-15 NOTE — Progress Notes (Signed)
Received paper death certificate from [facility], pt was found deceased at home, Dr Gala Romney discussed with pt's Fiance.  DC completed and signed by Dr Gala Romney, [facility] aware to p/u

## 2024-07-15 ENCOUNTER — Encounter (INDEPENDENT_AMBULATORY_CARE_PROVIDER_SITE_OTHER): Payer: Self-pay | Admitting: Gastroenterology
